# Patient Record
Sex: Female | Born: 1975 | Race: White | Hispanic: No | Marital: Married | Smoking: Former smoker
Health system: Southern US, Community
[De-identification: ages and names within clinical notes are randomized; demographics above are authoritative.]

## PROBLEM LIST (undated history)

## (undated) DIAGNOSIS — K589 Irritable bowel syndrome without diarrhea: Secondary | ICD-10-CM

## (undated) DIAGNOSIS — E119 Type 2 diabetes mellitus without complications: Secondary | ICD-10-CM

## (undated) DIAGNOSIS — H52209 Unspecified astigmatism, unspecified eye: Secondary | ICD-10-CM

## (undated) DIAGNOSIS — F419 Anxiety disorder, unspecified: Secondary | ICD-10-CM

## (undated) DIAGNOSIS — E282 Polycystic ovarian syndrome: Secondary | ICD-10-CM

## (undated) DIAGNOSIS — Z9889 Other specified postprocedural states: Secondary | ICD-10-CM

## (undated) DIAGNOSIS — K219 Gastro-esophageal reflux disease without esophagitis: Secondary | ICD-10-CM

## (undated) DIAGNOSIS — F32A Depression, unspecified: Secondary | ICD-10-CM

## (undated) DIAGNOSIS — Z973 Presence of spectacles and contact lenses: Secondary | ICD-10-CM

## (undated) DIAGNOSIS — M797 Fibromyalgia: Secondary | ICD-10-CM

## (undated) DIAGNOSIS — Z87898 Personal history of other specified conditions: Secondary | ICD-10-CM

## (undated) HISTORY — DX: Fibromyalgia: M79.7

## (undated) HISTORY — DX: Type 2 diabetes mellitus without complications: E11.9

## (undated) HISTORY — DX: Polycystic ovarian syndrome: E28.2

## (undated) HISTORY — PX: KNEE CARTILAGE SURGERY: SHX688

## (undated) HISTORY — PX: HX HYSTERECTOMY: SHX81

## (undated) HISTORY — PX: HX ACL RECONSTRUCTION: SHX115

## (undated) HISTORY — PX: HX TUBAL LIGATION: SHX77

## (undated) HISTORY — DX: Irritable bowel syndrome, unspecified: K58.9

## (undated) HISTORY — PX: HX MENISCECTOMY: SHX123

## (undated) HISTORY — PX: KNEE SURGERY: SHX244

---

## 2011-01-14 ENCOUNTER — Emergency Department (HOSPITAL_COMMUNITY): Payer: Worker's Compensation

## 2011-01-14 ENCOUNTER — Emergency Department (HOSPITAL_COMMUNITY)
Admission: EM | Admit: 2011-01-14 | Discharge: 2011-01-14 | Disposition: A | Discharge status: Home / Self Care | Payer: Worker's Compensation | Attending: Emergency Medicine | Admitting: Emergency Medicine

## 2011-01-14 ENCOUNTER — Encounter (HOSPITAL_COMMUNITY): Payer: Self-pay | Admitting: Emergency Medicine

## 2011-01-14 DIAGNOSIS — M25569 Pain in unspecified knee: Secondary | ICD-10-CM | POA: Insufficient documentation

## 2011-01-14 DIAGNOSIS — S8990XA Unspecified injury of unspecified lower leg, initial encounter: Secondary | ICD-10-CM | POA: Insufficient documentation

## 2011-01-14 DIAGNOSIS — S99929A Unspecified injury of unspecified foot, initial encounter: Secondary | ICD-10-CM | POA: Insufficient documentation

## 2011-01-14 DIAGNOSIS — X500XXA Overexertion from strenuous movement or load, initial encounter: Secondary | ICD-10-CM | POA: Insufficient documentation

## 2011-01-14 MED ORDER — OXYCODONE-ACETAMINOPHEN 5-325 MG PO TABS: 1.0000 | ORAL_TABLET | Freq: Four times a day (QID) | ORAL | Status: AC | PRN

## 2011-01-14 NOTE — ED Notes (Signed)
ZOX:[id number]<VW> Expected date:01/14/11<BR> Expected time:12:48 PM<BR> Means of arrival:Ambulance<BR> Comments:<BR> EMS 40 GC, 36 y/o female Right knee pain. Twisted knee on step.

## 2011-01-14 NOTE — ED Notes (Signed)
Stepped wrong while at work and has rt knee pain, has had a previous injury to this knee, no swelling or defomity noted,

## 2011-01-14 NOTE — ED Provider Notes (Signed)
History     CSN: [id number]  Arrival date & time 01/14/11  1305   First MD Initiated Contact with Patient 01/14/11 1342      Chief Complaint  Patient presents with  . Knee Pain    (Consider location/radiation/quality/duration/timing/severity/associated sxs/prior treatment) HPI  Pt stepped the wrong way while at work and felt a pop on the lateral side of  her right knee. Since then she has been unable to straighten her leg completely. She is also experiencing significant pain. She says the sensation is such that she feels like something is "balled up in their and keeping her knee from straightening". She denies falling, head injury, LOC. Pt had ACL repair surgery on this knee "many many years ago when she was in he [employer]".  She has no other significant PMH.   History reviewed. No pertinent past medical history.  History reviewed. No pertinent past surgical history.  No family history on file.  History  Substance Use Topics  . Smoking status: Not on file  . Smokeless tobacco: Not on file  . Alcohol Use: Not on file    OB History    Grav Para Term Preterm Abortions TAB SAB Ect Mult Living                  Review of Systems  All other systems reviewed and are negative.    Allergies  Review of patient's allergies indicates no known allergies.  Home Medications   Current Outpatient Rx  Name Route Sig Dispense Refill  . METFORMIN HCL 500 MG PO TABS Oral Take 500 mg by mouth daily with breakfast.    . OXYCODONE-ACETAMINOPHEN 5-325 MG PO TABS Oral Take 1 tablet by mouth every 6 (six) hours as needed for pain. 15 tablet 0    BP 112/70  Pulse 66  Temp 97.6 F (36.4 C)  SpO2 100%  Physical Exam  Constitutional: She is oriented to person, place, and time. She appears well-developed and well-nourished.  HENT:  Head: Normocephalic and atraumatic.  Eyes: Pupils are equal, round, and reactive to light.  Neck: Trachea normal, normal range of motion and full passive  range of motion without pain. Neck supple.  Cardiovascular: Normal rate, regular rhythm and normal pulses.   Pulmonary/Chest: Effort normal. Chest wall is not dull to percussion. She exhibits no tenderness, no crepitus, no edema, no deformity and no retraction.  Abdominal: Normal appearance.  Musculoskeletal:       Right knee: She exhibits decreased range of motion, swelling and effusion. She exhibits no ecchymosis, no deformity, no laceration, no erythema, normal alignment, no LCL laxity, normal patellar mobility, no bony tenderness, normal meniscus and no MCL laxity. tenderness found. Lateral joint line tenderness noted. No medial joint line, no MCL, no LCL and no patellar tendon tenderness noted.       Legs: Lymphadenopathy:       Head (right side): No submental, no submandibular, no tonsillar, no preauricular, no posterior auricular and no occipital adenopathy present.       Head (left side): No submental, no submandibular, no tonsillar, no preauricular, no posterior auricular and no occipital adenopathy present.    She has no cervical adenopathy.    She has no axillary adenopathy.  Neurological: She is oriented to person, place, and time. She has normal strength.  Skin: Skin is warm, dry and intact.  Psychiatric: Her speech is normal. Cognition and memory are normal.    ED Course  Procedures (including critical care time)  Labs Reviewed - No data to display Dg Knee Complete 4 Views Right  01/14/2011  *RADIOLOGY REPORT*  Clinical Data: Right knee pain.  RIGHT KNEE - COMPLETE 4+ VIEW  Comparison: None  Findings: There are surgical changes from ACL reconstruction. There are moderate tricompartmental degenerative changes.  No acute fracture or osteochondral abnormality.  No definite joint effusion.  IMPRESSION: Degenerative changes and postoperative changes but no acute fracture.  Original Report Authenticated By: P. Loralie Champagne, M.D.     1. Knee injury       MDM  Patient given  crutches and a knee sleeve. Pt is to call Ortho today or tomorrow for a follow up apointment and further management. She has been made aware that while the xray was negative, it does not show ligaments and tendons. In the meantime the patient is to ice, rest and elevate her knee.        Dorthula Matas, PA 01/15/11 [phone]

## 2011-01-15 NOTE — ED Provider Notes (Signed)
Medical screening examination/treatment/procedure(s) were performed by non-physician practitioner and as supervising physician I was immediately available for consultation/collaboration.  Donnetta Hutching, MD 01/15/11 1254

## 2011-01-20 ENCOUNTER — Other Ambulatory Visit: Payer: Self-pay | Admitting: Orthopedic Surgery

## 2011-01-20 ENCOUNTER — Ambulatory Visit
Admission: RE | Admit: 2011-01-20 | Discharge: 2011-01-20 | Disposition: A | Discharge status: Home / Self Care | Payer: PRIVATE HEALTH INSURANCE | Source: Ambulatory Visit | Attending: Orthopedic Surgery | Admitting: Orthopedic Surgery

## 2011-01-20 DIAGNOSIS — M25561 Pain in right knee: Secondary | ICD-10-CM

## 2011-03-16 ENCOUNTER — Other Ambulatory Visit: Payer: Self-pay | Admitting: Hematology and Oncology

## 2013-09-18 DIAGNOSIS — N390 Urinary tract infection, site not specified: Secondary | ICD-10-CM | POA: Insufficient documentation

## 2013-09-18 DIAGNOSIS — M545 Low back pain, unspecified: Secondary | ICD-10-CM | POA: Insufficient documentation

## 2014-10-24 ENCOUNTER — Emergency Department (HOSPITAL_COMMUNITY)
Admission: EM | Admit: 2014-10-24 | Discharge: 2014-10-24 | Disposition: A | Discharge status: Home / Self Care | Payer: BLUE CROSS/BLUE SHIELD | Attending: Emergency Medicine | Admitting: Emergency Medicine

## 2014-10-24 ENCOUNTER — Encounter (HOSPITAL_COMMUNITY): Payer: Self-pay | Admitting: Emergency Medicine

## 2014-10-24 ENCOUNTER — Emergency Department (HOSPITAL_COMMUNITY): Payer: BLUE CROSS/BLUE SHIELD

## 2014-10-24 DIAGNOSIS — R079 Chest pain, unspecified: Secondary | ICD-10-CM | POA: Diagnosis present

## 2014-10-24 DIAGNOSIS — R0789 Other chest pain: Secondary | ICD-10-CM

## 2014-10-24 DIAGNOSIS — Z87891 Personal history of nicotine dependence: Secondary | ICD-10-CM | POA: Insufficient documentation

## 2014-10-24 DIAGNOSIS — R000 Tachycardia, unspecified: Secondary | ICD-10-CM | POA: Diagnosis not present

## 2014-10-24 DIAGNOSIS — E119 Type 2 diabetes mellitus without complications: Secondary | ICD-10-CM | POA: Diagnosis not present

## 2014-10-24 DIAGNOSIS — Z79899 Other long term (current) drug therapy: Secondary | ICD-10-CM | POA: Insufficient documentation

## 2014-10-24 DIAGNOSIS — K219 Gastro-esophageal reflux disease without esophagitis: Secondary | ICD-10-CM

## 2014-10-24 HISTORY — DX: Type 2 diabetes mellitus without complications: E11.9

## 2014-10-24 LAB — BASIC METABOLIC PANEL
ANION GAP: 11 (ref 5–15)
BUN: 20 mg/dL (ref 6–20)
CALCIUM: 9.3 mg/dL (ref 8.9–10.3)
CO2: 21 mmol/L — ABNORMAL LOW (ref 22–32)
CREATININE: 0.87 mg/dL (ref 0.44–1.00)
Chloride: 107 mmol/L (ref 101–111)
GLUCOSE: 139 mg/dL — AB (ref 65–99)
POTASSIUM: 4.1 mmol/L (ref 3.5–5.1)
SODIUM: 139 mmol/L (ref 135–145)

## 2014-10-24 LAB — HEPATIC FUNCTION PANEL
ALT: 23 U/L (ref 14–54)
AST: 20 U/L (ref 15–41)
Albumin: 3.6 g/dL (ref 3.5–5.0)
Alkaline Phosphatase: 67 U/L (ref 38–126)
Bilirubin, Direct: <0.1 mg/dL — ABNORMAL LOW (ref 0.1–0.5)
Total Bilirubin: 0.4 mg/dL (ref 0.3–1.2)
Total Protein: 7.2 g/dL (ref 6.5–8.1)

## 2014-10-24 LAB — I-STAT TROPONIN, ED
Troponin i, poc: 0 ng/mL (ref 0.00–0.08)
Troponin i, poc: 0 ng/mL (ref 0.00–0.08)

## 2014-10-24 LAB — D-DIMER, QUANTITATIVE: D-Dimer, Quant: 0.34 ug{FEU}/mL (ref 0.00–0.48)

## 2014-10-24 LAB — URINALYSIS, ROUTINE W REFLEX MICROSCOPIC
Bilirubin Urine: NEGATIVE
Glucose, UA: >1000 mg/dL — AB
Hgb urine dipstick: NEGATIVE
Ketones, ur: >80 mg/dL — AB
Leukocytes, UA: NEGATIVE
Nitrite: NEGATIVE
Protein, ur: NEGATIVE mg/dL
Specific Gravity, Urine: 1.042 — ABNORMAL HIGH (ref 1.005–1.030)
Urobilinogen, UA: 0.2 mg/dL (ref 0.0–1.0)
pH: 5 (ref 5.0–8.0)

## 2014-10-24 LAB — CBC
HEMATOCRIT: 42.1 % (ref 36.0–46.0)
Hemoglobin: 14.1 g/dL (ref 12.0–15.0)
MCH: 30.3 pg (ref 26.0–34.0)
MCHC: 33.5 g/dL (ref 30.0–36.0)
MCV: 90.3 fL (ref 78.0–100.0)
Platelets: 366 10*3/uL (ref 150–400)
RBC: 4.66 MIL/uL (ref 3.87–5.11)
RDW: 13.6 % (ref 11.5–15.5)
WBC: 10.5 10*3/uL (ref 4.0–10.5)

## 2014-10-24 LAB — URINE MICROSCOPIC-ADD ON

## 2014-10-24 LAB — LIPASE, BLOOD: Lipase: 30 U/L (ref 11–51)

## 2014-10-24 MED ORDER — LORAZEPAM 1 MG PO TABS
1.0000 mg | ORAL_TABLET | Freq: Once | ORAL | Status: AC
  Administered 2014-10-24: 1 mg via ORAL
  Filled 2014-10-24: qty 1

## 2014-10-24 MED ORDER — SODIUM CHLORIDE 0.9 % IV BOLUS (SEPSIS)
1000.0000 mL | Freq: Once | INTRAVENOUS | Status: AC
  Administered 2014-10-24: 1000 mL via INTRAVENOUS

## 2014-10-24 NOTE — ED Provider Notes (Signed)
CSN: [id number]     Arrival date & time 10/24/14  4098 History   First MD Initiated Contact with Patient 10/24/14 1004     Chief Complaint  Patient presents with  . Chest Pain     (Consider location/radiation/quality/duration/timing/severity/associated sxs/prior Treatment) HPI  Jasmine Logan is a 39 y.o F with a pmhx of DM who presents the emergency department today complaining of chest pain. States that she began to have substernal chest pain on Tuesday, intermittent in nature. Patient saw her PCP yesterday who told her that it was noncardiac related and sent home on antacid medication for GERD. Patient states that today she had an episode of dizziness and near syncope in her chest pain has worsened now radiating into her left shoulder. Patient came to the emergent department to rule out cardiac-related cause. Denies shortness of breath, nausea, current dizziness, syncope, lightheadedness, headache, numbness/tingling, weakness. Patient is on oral birth control. Denies tobacco use. Past Medical History  Diagnosis Date  . Diabetes mellitus without complication [facility])    Past Surgical History  Procedure Laterality Date  . Cesarean section    . Knee surgery     No family history on file. Social History  Substance Use Topics  . Smoking status: Former Games developer  . Smokeless tobacco: None  . Alcohol Use: No   OB History    No data available     Review of Systems  All other systems reviewed and are negative.     Allergies  Review of patient's allergies indicates no known allergies.  Home Medications   Prior to Admission medications   Medication Sig Start Date End Date Taking? Authorizing Provider  dapagliflozin propanediol (FARXIGA) 10 MG TABS tablet Take 10 mg by mouth daily.   Yes Historical Provider, MD  esomeprazole (NEXIUM) 40 MG capsule Take 40 mg by mouth daily at 12 noon.   Yes Historical Provider, MD  Liraglutide (VICTOZA) 18 MG/3ML SOPN Inject 1.8 mg into the skin  daily.   Yes Historical Provider, MD  NAPROXEN PO Take 400 mg by mouth as needed (used as needed for mild pain).   Yes Historical Provider, MD  norethindrone-ethinyl estradiol (MICROGESTIN,JUNEL,LOESTRIN) 1-20 MG-MCG tablet Take 1 tablet by mouth daily.   Yes Historical Provider, MD   BP 134/68 mmHg  Pulse 99  Temp(Src) 99.1 F (37.3 C) (Oral)  Resp 22  Ht  (1.626 m)  Wt 208 lb (94.348 kg)  BMI 35.69 kg/m2  SpO2 100%  LMP 10/24/2014 Physical Exam  Constitutional: She is oriented to person, place, and time. She appears well-developed and well-nourished. No distress.  HENT:  Head: Normocephalic and atraumatic.  Mouth/Throat: No oropharyngeal exudate.  Eyes: Conjunctivae and EOM are normal. Pupils are equal, round, and reactive to light. Right eye exhibits no discharge. Left eye exhibits no discharge. No scleral icterus.  Neck: Normal range of motion. Neck supple.  Cardiovascular: Regular rhythm, normal heart sounds and intact distal pulses.  Exam reveals no gallop and no friction rub.   No murmur heard. Tachycardic to 105.  Pulmonary/Chest: Effort normal and breath sounds normal. No respiratory distress. She has no wheezes. She has no rales. She exhibits no tenderness.  Abdominal: Soft. Bowel sounds are normal. She exhibits no distension and no mass. There is tenderness ( Mild tenderness to epigastrium). There is no rebound and no guarding.  Musculoskeletal: Normal range of motion. She exhibits no edema.  Neurological: She is alert and oriented to person, place, and time. No cranial nerve deficit.  Strength 5/5 throughout. No sensory deficits.  No gait abnormality.  Skin: Skin is warm and dry. No rash noted. She is not diaphoretic. No erythema. No pallor.  Psychiatric: She has a normal mood and affect. Her behavior is normal.  Patient very nervous and anxious  Nursing note and vitals reviewed.   ED Course  Procedures (including critical care time) Labs Review Labs Reviewed   BASIC METABOLIC PANEL - Abnormal; Notable for the following:    CO2 21 (*)    Glucose, Bld 139 (*)    All other components within normal limits  HEPATIC FUNCTION PANEL - Abnormal; Notable for the following:    Bilirubin, Direct <0.1 (*)    All other components within normal limits  URINALYSIS, ROUTINE W REFLEX MICROSCOPIC (NOT AT [facility]) - Abnormal; Notable for the following:    Specific Gravity, Urine 1.042 (*)    Glucose, UA >1000 (*)    Ketones, ur >80 (*)    All other components within normal limits  CBC  LIPASE, BLOOD  URINE MICROSCOPIC-ADD ON  D-DIMER, QUANTITATIVE (NOT AT [facility])  Rosezena SensorI-STAT TROPOININ, ED  Rosezena SensorI-STAT TROPOININ, ED    Imaging Review Dg Chest 2 View  10/24/2014  CLINICAL DATA:  Chest pain for 3 days. EXAM: CHEST  2 VIEW COMPARISON:  12/29/2013 FINDINGS: The heart size and mediastinal contours are within normal limits. Both lungs are clear. No evidence of pneumothorax or pleural effusion. The visualized skeletal structures are unremarkable. IMPRESSION: No active cardiopulmonary disease. Electronically Signed   By: Myles RosenthalJohn  Stahl M.D.   On: 10/24/2014 11:41   I have personally reviewed and evaluated these images and lab results as part of my medical decision-making.   EKG Interpretation   Date/Time:  Thursday October 24 2014 10:11:10 EDT Ventricular Rate:  103 PR Interval:  128 QRS Duration: 80 QT Interval:  362 QTC Calculation: 474 R Axis:   -4 Text Interpretation:  Sinus tachycardia Otherwise normal ECG Confirmed by  ALLEN  MD, ANTHONY ([id number]) on 10/24/2014 10:20:08 AM      MDM   Final diagnoses:  Atypical chest pain  Gastroesophageal reflux disease, esophagitis presence not specified    Pt with low HEART score presents with atypical chest pain onset 2 days ago. Pt is tachycardic to 103. However, pt states that this is typical for her. Patient also with high specific gravity of urine. Dehydration may be causing patient to be tachycardic as well. We'll give 1  L fluids and 1 of Ativan for anxiety.  EKG unremarkable. Troponin negative. Patient is on oral birth control. Cannot PERC out. D-dimer ordered and is negative. Rule out PE. Low suspicion ACS. Patient now pain free.  Patient also with epigastrium discomfort. Suspect this is more related to GERD and anxiety. Patient placed on Nexium.  Return precautions outlined in patient discharge instructions. Patient was discussed with and seen by Dr. Freida BusmanAllen who agrees with the treatment plan.     Lester KinsmanSamantha Tripp [place], PA-C 10/24/14 1949

## 2014-10-24 NOTE — ED Provider Notes (Signed)
Medical screening examination/treatment/procedure(s) were conducted as a shared visit with non-physician practitioner(s) and myself.  I personally evaluated the patient during the encounter.   EKG Interpretation   Date/Time:  Thursday October 24 2014 10:11:10 EDT Ventricular Rate:  103 PR Interval:  128 QRS Duration: 80 QT Interval:  362 QTC Calculation: 474 R Axis:   -4 Text Interpretation:  Sinus tachycardia Otherwise normal ECG Confirmed by  Naftali Carchi  MD, Chanise Habeck ([id number]) on 10/24/2014 10:20:08 AM      Patient here complaining of abdominal discomfort and bloating. Has had increased anxiety. She is dehydrated here and no evidence of ACS or PE. Will treat symptomatically and discharge  Lorre NickAnthony Nasiir Monts, MD 10/24/14 1556

## 2014-10-24 NOTE — ED Notes (Signed)
CP onset tues, seen at PCP, still reports left sided CP, shoulder pain, c/o dizziness, SOB, nausea with no vomiting, A/O X4, NAD

## 2014-10-24 NOTE — Discharge Instructions (Signed)
Chest Pain Observation °It is often hard to give a specific diagnosis for the cause of chest pain. Among other possibilities your symptoms might be caused by inadequate oxygen delivery to your heart (angina). Angina that is not treated or evaluated can lead to a heart attack (myocardial infarction) or death. °Blood tests, electrocardiograms, and X-rays may have been done to help determine a possible cause of your chest pain. After evaluation and observation, your health care provider has determined that it is unlikely your pain was caused by an unstable condition that requires hospitalization. However, a full evaluation of your pain may need to be completed, with additional diagnostic testing as directed. It is very important to keep your follow-up appointments. Not keeping your follow-up appointments could result in permanent heart damage, disability, or death. If there is any problem keeping your follow-up appointments, you must call your health care provider. °HOME CARE INSTRUCTIONS  °Due to the slight chance that your pain could be angina, it is important to follow your health care provider's treatment plan and also maintain a healthy lifestyle: °· Maintain or work toward achieving a healthy weight. °· Stay physically active and exercise regularly. °· Decrease your salt intake. °· Eat a balanced, healthy diet. Talk to a dietitian to learn about heart-healthy foods. °· Increase your fiber intake by including whole grains, vegetables, fruits, and nuts in your diet. °· Avoid situations that cause stress, anger, or depression. °· Take medicines as advised by your health care provider. Report any side effects to your health care provider. Do not stop medicines or adjust the dosages on your own. °· Quit smoking. Do not use nicotine patches or gum until you check with your health care provider. °· Keep your blood pressure, blood sugar, and cholesterol levels within normal limits. °· Limit alcohol intake to no more than  1 drink per day for women who are not pregnant and 2 drinks per day for men. °· Do not abuse drugs. °SEEK IMMEDIATE MEDICAL CARE IF: °You have severe chest pain or pressure which may include symptoms such as: °· You feel pain or pressure in your arms, neck, jaw, or back. °· You have severe back or abdominal pain, feel sick to your stomach (nauseous), or throw up (vomit). °· You are sweating profusely. °· You are having a fast or irregular heartbeat. °· You feel short of breath while at rest. °· You notice increasing shortness of breath during rest, sleep, or with activity. °· You have chest pain that does not get better after rest or after taking your usual medicine. °· You wake from sleep with chest pain. °· You are unable to sleep because you cannot breathe. °· You develop a frequent cough or you are coughing up blood. °· You feel dizzy, faint, or experience extreme fatigue. °· You develop severe weakness, dizziness, fainting, or chills. °Any of these symptoms may represent a serious problem that is an emergency. Do not wait to see if the symptoms will go away. Call your local emergency services (911 in the [place] Do not drive yourself to the hospital. °MAKE SURE YOU: °· Understand these instructions. °· Will watch your condition. °· Will get help right away if you are not doing well or get worse. °  °This information is not intended to replace advice given to you by your health care provider. Make sure you discuss any questions you have with your health care provider. °  °Document Released: 01/23/2010 Document Revised: 12/26/2012 Document Reviewed: 06/22/2012 °Elsevier Interactive Patient   Education 2016 [place].  Gastroesophageal Reflux Disease, Adult Normally, food travels down the esophagus and stays in the stomach to be digested. However, when a person has gastroesophageal reflux disease (GERD), food and stomach acid move back up into the esophagus. When this happens, the esophagus becomes sore and  inflamed. Over time, GERD can create small holes (ulcers) in the lining of the esophagus.  CAUSES This condition is caused by a problem with the muscle between the esophagus and the stomach (lower esophageal sphincter, or LES). Normally, the LES muscle closes after food passes through the esophagus to the stomach. When the LES is weakened or abnormal, it does not close properly, and that allows food and stomach acid to go back up into the esophagus. The LES can be weakened by certain dietary substances, medicines, and medical conditions, including:  Tobacco use.  Pregnancy.  Having a hiatal hernia.  Heavy alcohol use.  Certain foods and beverages, such as coffee, chocolate, onions, and peppermint. RISK FACTORS This condition is more likely to develop in:  People who have an increased body weight.  People who have connective tissue disorders.  People who use NSAID medicines. SYMPTOMS Symptoms of this condition include:  Heartburn.  Difficult or painful swallowing.  The feeling of having a lump in the throat.  Abitter taste in the mouth.  Bad breath.  Having a large amount of saliva.  Having an upset or bloated stomach.  Belching.  Chest pain.  Shortness of breath or wheezing.  Ongoing (chronic) cough or a night-time cough.  Wearing away of tooth enamel.  Weight loss. Different conditions can cause chest pain. Make sure to see your health care provider if you experience chest pain. DIAGNOSIS Your health care provider will take a medical history and perform a physical exam. To determine if you have mild or severe GERD, your health care provider may also monitor how you respond to treatment. You may also have other tests, including:  An endoscopy toexamine your stomach and esophagus with a small camera.  A test thatmeasures the acidity level in your esophagus.  A test thatmeasures how much pressure is on your esophagus.  A barium swallow or modified barium  swallow to show the shape, size, and functioning of your esophagus. TREATMENT The goal of treatment is to help relieve your symptoms and to prevent complications. Treatment for this condition may vary depending on how severe your symptoms are. Your health care provider may recommend:  Changes to your diet.  Medicine.  Surgery. HOME CARE INSTRUCTIONS Diet  Follow a diet as recommended by your health care provider. This may involve avoiding foods and drinks such as:  Coffee and tea (with or without caffeine).  Drinks that containalcohol.  Energy drinks and sports drinks.  Carbonated drinks or sodas.  Chocolate and cocoa.  Peppermint and mint flavorings.  Garlic and onions.  Horseradish.  Spicy and acidic foods, including peppers, chili powder, curry powder, vinegar, hot sauces, and barbecue sauce.  Citrus fruit juices and citrus fruits, such as oranges, lemons, and limes.  Tomato-based foods, such as red sauce, chili, salsa, and pizza with red sauce.  Fried and fatty foods, such as donuts, french fries, potato chips, and high-fat dressings.  High-fat meats, such as hot dogs and fatty cuts of red and white meats, such as rib eye steak, sausage, ham, and bacon.  High-fat dairy items, such as whole milk, butter, and cream cheese.  Eat small, frequent meals instead of large meals.  Avoid drinking  large amounts of liquid with your meals.  Avoid eating meals during the 2-3 hours before bedtime.  Avoid lying down right after you eat.  Do not exercise right after you eat. General Instructions  Pay attention to any changes in your symptoms.  Take over-the-counter and prescription medicines only as told by your health care provider. Do not take aspirin, ibuprofen, or other NSAIDs unless your health care provider told you to do so.  Do not use any tobacco products, including cigarettes, chewing tobacco, and e-cigarettes. If you need help quitting, ask your health care  provider.  Wear loose-fitting clothing. Do not wear anything tight around your waist that causes pressure on your abdomen.  Raise (elevate) the head of your bed 6 inches (15cm).  Try to reduce your stress, such as with yoga or meditation. If you need help reducing stress, ask your health care provider.  If you are overweight, reduce your weight to an amount that is healthy for you. Ask your health care provider for guidance about a safe weight loss goal.  Keep all follow-up visits as told by your health care provider. This is important. SEEK MEDICAL CARE IF:  You have new symptoms.  You have unexplained weight loss.  You have difficulty swallowing, or it hurts to swallow.  You have wheezing or a persistent cough.  Your symptoms do not improve with treatment.  You have a hoarse voice. SEEK IMMEDIATE MEDICAL CARE IF:  You have pain in your arms, neck, jaw, teeth, or back.  You feel sweaty, dizzy, or light-headed.  You have chest pain or shortness of breath.  You vomit and your vomit looks like blood or coffee grounds.  You faint.  Your stool is bloody or black.  You cannot swallow, drink, or eat.   This information is not intended to replace advice given to you by your health care provider. Make sure you discuss any questions you have with your health care provider.  Follow up with cardiology for further evaluation of atypical chest pain. Possible need for stress test. Take prescribed antacids for GERD. Avoid spicy and carbonated food and beverages. Return to the emergency department if you experience worsening of your symptoms, loss of consciousness, shortness of breath.

## 2014-10-24 NOTE — ED Notes (Signed)
Phlebotomy at bedside.

## 2015-05-12 ENCOUNTER — Ambulatory Visit: Admit: 2015-05-12 | Discharge: 2015-05-12 | Payer: MEDICAID | Attending: Surgery | Primary: Family Medicine

## 2015-05-12 DIAGNOSIS — R928 Other abnormal and inconclusive findings on diagnostic imaging of breast: Secondary | ICD-10-CM

## 2015-05-12 NOTE — Progress Notes (Signed)
CHIEF COMPLAINT: Right breast mass    HISTORY OF PRESENT ILLNESS:    Tammy Nguyen is a 40 y.o. white female who presents with an with an abnormal mammogram.  She had some questionable palpable abnormalities and had a mammogram that she is concerned about.    Mammogram done Myrtice 11, 2017 shows extremely dense breast tissue. There are no dominant mass lesions demonstrated. There are no suspicious clustered calcifications or areas of skin thickening.    Right breast ultrasound done same day directed to the region of the questionable palpable abnormality upper outer right breast.  There are multiple small anechoic, cystic lesion suggested. The largest measures 7 mm in size. No solid lesion is identified.    IMPRESSION:  #1 negative mammogram with no evidence of malignancy, although, the dense breast parenchyma does limit evaluation. Routine follow-up mammography in 1 year is recommended  #2 multiple small cysts noted in the lateral right breast.    BI-RADS Category 2-benign findings        She has a negative family history of breast cancer.        BREAST EXAM:    Tammy Nguyen  has unremarkable fibrocystic changes throughout both breasts. There are no dominant masses, no skin or nipple changes and no axillary adenopathy.  I see nothing suspicious on physical examination.      The area of interest  feels like normal nodular breast tissue.    IMPRESSION:  Benign fibrocystic changes                           Low probability of malignancy    PLAN:    I plan to have patient to [facility] in 6 months for physical exam only. She will call if there are any changes.     DISCUSSION:  We discussed  the fibrocystic disease and its relationship to breast cancer  , the pathophysiology of breast cancer, the pathophysiology of fibrocystic disease, the importance of routine mammograms, the technique of good breast self-examination and encouraged her and the effect of caffeine on her breasts    Over 50% of visit time was spent counseling patient.  30  minutes of face to face time spent with patient.

## 2015-05-13 DIAGNOSIS — R928 Other abnormal and inconclusive findings on diagnostic imaging of breast: Secondary | ICD-10-CM

## 2015-11-12 ENCOUNTER — Encounter: Attending: Surgery | Primary: Family Medicine

## 2015-11-12 NOTE — Progress Notes (Signed)
CHIEF COMPLAINT: Right breast mass    HISTORY OF PRESENT ILLNESS:    Tammy Nguyen is a 40 y.o. white female who presents with an with an abnormal mammogram.  She had some questionable palpable abnormalities and had a mammogram that she is concerned about.    Mammogram done Myrtice 11, 2017 shows extremely dense breast tissue. There are no dominant mass lesions demonstrated. There are no suspicious clustered calcifications or areas of skin thickening.    Right breast ultrasound done same day directed to the region of the questionable palpable abnormality upper outer right breast.  There are multiple small anechoic, cystic lesion suggested. The largest measures 7 mm in size. No solid lesion is identified.    IMPRESSION:  #1 negative mammogram with no evidence of malignancy, although, the dense breast parenchyma does limit evaluation. Routine follow-up mammography in 1 year is recommended  #2 multiple small cysts noted in the lateral right breast.    BI-RADS Category 2-benign findings        She has a negative family history of breast cancer.        BREAST EXAM:    Tammy Nguyen  has unremarkable fibrocystic changes throughout both breasts. There are no dominant masses, no skin or nipple changes and no axillary adenopathy.  I see nothing suspicious on physical examination.      The area of interest  feels like normal nodular breast tissue.    IMPRESSION:  Benign fibrocystic changes                           Low probability of malignancy    PLAN:    I plan to have patient to [facility] in 6 months for physical exam only. She will call if there are any changes.     DISCUSSION:  We discussed  the fibrocystic disease and its relationship to breast cancer  , the pathophysiology of breast cancer, the pathophysiology of fibrocystic disease, the importance of routine mammograms, the technique of good breast self-examination and encouraged her and the effect of caffeine on her breasts    Over 50% of visit time was spent counseling patient.  30  minutes of face to face time spent with patient.

## 2016-07-28 NOTE — Progress Notes (Signed)
This encounter was created in error - please disregard.

## 2017-10-06 DIAGNOSIS — R002 Palpitations: Secondary | ICD-10-CM | POA: Insufficient documentation

## 2017-10-06 DIAGNOSIS — E119 Type 2 diabetes mellitus without complications: Secondary | ICD-10-CM | POA: Insufficient documentation

## 2017-10-12 ENCOUNTER — Encounter (INDEPENDENT_AMBULATORY_CARE_PROVIDER_SITE_OTHER): Payer: Self-pay

## 2017-10-12 ENCOUNTER — Ambulatory Visit: Payer: BC Managed Care – PPO | Admitting: Cardiology

## 2017-10-12 ENCOUNTER — Encounter: Payer: Self-pay | Admitting: Cardiology

## 2017-10-12 DIAGNOSIS — R002 Palpitations: Secondary | ICD-10-CM

## 2017-10-12 DIAGNOSIS — E1369 Other specified diabetes mellitus with other specified complication: Secondary | ICD-10-CM | POA: Diagnosis not present

## 2017-10-12 DIAGNOSIS — R0789 Other chest pain: Secondary | ICD-10-CM

## 2017-10-12 NOTE — Patient Instructions (Signed)
Medication Instructions:  Your physician recommends that you continue on your current medications as directed. Please refer to the Current Medication list given to you today.  If you need a refill on your cardiac medications before your next appointment, please call your pharmacy.   Lab work: None  If you have labs (blood work) drawn today and your tests are completely normal, you will receive your results only by: Marland Kitchen MyChart Message (if you have MyChart) OR . A paper copy in the mail If you have any lab test that is abnormal or we need to change your treatment, we will call you to review the results.  Testing/Procedures: Your physician has requested that you have an exercise tolerance test. For further information please visit [email or web address] Please also follow instruction sheet, as given.  Your physician has recommended that you wear an event monitor. Event monitors are medical devices that record the heart's electrical activity. Doctors most often [place] these monitors to diagnose arrhythmias. Arrhythmias are problems with the speed or rhythm of the heartbeat. The monitor is a small, portable device. You can wear one while you do your normal daily activities. This is usually used to diagnose what is causing palpitations/syncope (passing out).  Follow-Up: At [facility], you and your health needs are our priority.  As part of our continuing mission to provide you with exceptional heart care, we have created designated Provider Care Teams.  These Care Teams include your primary Cardiologist (physician) and Advanced Practice Providers (APPs -  Physician Assistants and Nurse Practitioners) who all work together to provide you with the care you need, when you need it.  You will need a follow up appointment in 6 months.  Please call our office 2 months in advance to schedule this appointment.  You may see another member of our [employer] Provider Team in [place]: Gypsy Balsam,  MD . Norman Herrlich, MD  Any Other Special Instructions Will Be Listed Below (If Applicable).

## 2017-10-12 NOTE — Progress Notes (Signed)
Cardiology Office Note:    Date:  10/12/2017   ID:  [name], [name] [birth year], MRN [id number]  PCP:  Paulina Fusi, MD  Cardiologist:  Garwin Brothers, MD   Referring MD: Paulina Fusi, MD    ASSESSMENT:    1. Palpitation   2. Other specified diabetes mellitus with other specified complication, unspecified whether long term insulin use (HCC)   3. Chest discomfort    PLAN:    In order of problems listed above:  1. Primary prevention stressed with the patient.  Importance of compliance with diet and medication stressed and she vocalized understanding.  Her blood pressure is stable.  Diet was discussed for diabetes mellitus and weight reduction was stressed and she vocalized understanding.  She will need to talk to her primary care physician about issues such as statin therapy and aspirin for diabetes.  She also could be considered for ACE inhibitor therapy for diabetes but her blood pressure is borderline.  All these issues I would leave up to her primary care provider to decide. 2. In view of her significant symptoms she will have a 1 month event monitor.  This will help me assess her palpitations.  Her TSH is unremarkable.  We will also do exercise treadmill stress test to reassure her.  She has had coronary angiography about 2 years ago which was unremarkable and I discussed this with her at length. 3. Patient will be seen in follow-up appointment in 6 months or earlier if the patient has any concerns    Medication Adjustments/Labs and Tests Ordered: Current medicines are reviewed at length with the patient today.  Concerns regarding medicines are outlined above.  No orders of the defined types were placed in this encounter.  No orders of the defined types were placed in this encounter.    History of Present Illness:    Jasmine Logan is a 42 y.o. female who is being seen today for the evaluation of palpitations and chest discomfort at the request of Paulina Fusi, MD.  Patient is a pleasant 42 year old female.  She has past medical history of diabetes mellitus.  She is overweight.  She mentions to me that she had an episode of a strange heart feeling.  She says this was something like palpitations lasted about 15 minutes.  She went to the urgent care and there was she was sent to the emergency room.  In the emergency room she was treated or other evaluated and discharged without any significant findings and she is here for follow-up.  No chest pain orthopnea or PND.  She is an active lady.  No orthopnea or PND she does not exercise on a regular basis.  At the time of my evaluation, the patient is alert awake oriented and in no distress.  Past Medical History:  Diagnosis Date  . Diabetes mellitus without complication [facility])     Past Surgical History:  Procedure Laterality Date  . CESAREAN SECTION    . KNEE SURGERY      Current Medications: Current Meds  Medication Sig  . busPIRone (BUSPAR) 5 MG tablet Take 5 mg by mouth as needed.  . Liraglutide (VICTOZA) 18 MG/3ML SOPN Inject 1.8 mg into the skin daily.  . ondansetron (ZOFRAN) 4 MG tablet Take 4 mg by mouth every 8 (eight) hours as needed for nausea or vomiting.     Allergies:   Patient has no known allergies.   Social History   Socioeconomic History  .  Marital status: Married    Spouse name: Not on file  . Number of children: Not on file  . Years of education: Not on file  . Highest education level: Not on file  Occupational History  . Not on file  Social Needs  . Financial resource strain: Not on file  . Food insecurity:    Worry: Not on file    Inability: Not on file  . Transportation needs:    Medical: Not on file    Non-medical: Not on file  Tobacco Use  . Smoking status: Former Games developer  . Smokeless tobacco: Never Used  Substance and Sexual Activity  . Alcohol use: No  . Drug use: Not on file  . Sexual activity: Yes    Birth control/protection: Pill  Lifestyle  .  Physical activity:    Days per week: Not on file    Minutes per session: Not on file  . Stress: Not on file  Relationships  . Social connections:    Talks on phone: Not on file    Gets together: Not on file    Attends religious service: Not on file    Active member of club or organization: Not on file    Attends meetings of clubs or organizations: Not on file    Relationship status: Not on file  Other Topics Concern  . Not on file  Social History Narrative  . Not on file     Family History: The patient's family history includes Cancer in her maternal grandfather and maternal grandmother.  ROS:   Please see the history of present illness.    All other systems reviewed and are negative.  EKGs/Labs/Other Studies Reviewed:    The following studies were reviewed today: EKG reveals sinus rhythm and nonspecific ST-T changes.   Recent Labs: No results found for requested labs within last 8760 hours.  Recent Lipid Panel No results found for: CHOL, TRIG, HDL, CHOLHDL, VLDL, LDLCALC, LDLDIRECT  Physical Exam:    VS:  BP 122/70 (BP Location: Right Arm, Patient Position: Sitting, Cuff Size: Normal)   Pulse 76   Ht 5\' 4"  (1.626 m)   Wt 212 lb (96.2 kg)   SpO2 98%   BMI 36.39 kg/m     Wt Readings from Last 3 Encounters:  10/12/17 212 lb (96.2 kg)  10/24/14 208 lb (94.3 kg)     GEN: Patient is in no acute distress HEENT: Normal NECK: No JVD; No carotid bruits LYMPHATICS: No lymphadenopathy CARDIAC: S1 S2 regular, 2/6 systolic murmur at the apex. RESPIRATORY:  Clear to auscultation without rales, wheezing or rhonchi  ABDOMEN: Soft, non-tender, non-distended MUSCULOSKELETAL:  No edema; No deformity  SKIN: Warm and dry NEUROLOGIC:  Alert and oriented x 3 PSYCHIATRIC:  Normal affect    Signed, Garwin Brothers, MD  10/12/2017 9:31 AM    [facility]

## 2017-11-04 ENCOUNTER — Ambulatory Visit (INDEPENDENT_AMBULATORY_CARE_PROVIDER_SITE_OTHER): Payer: BC Managed Care – PPO

## 2017-11-04 DIAGNOSIS — R002 Palpitations: Secondary | ICD-10-CM | POA: Diagnosis not present

## 2017-12-16 ENCOUNTER — Ambulatory Visit (INDEPENDENT_AMBULATORY_CARE_PROVIDER_SITE_OTHER): Payer: BC Managed Care – PPO

## 2017-12-16 DIAGNOSIS — R002 Palpitations: Secondary | ICD-10-CM | POA: Diagnosis not present

## 2017-12-16 LAB — EXERCISE TOLERANCE TEST
CSEPEDS: 0 s
CSEPEW: 7 METS
CSEPHR: 97 %
CSEPPHR: 173 {beats}/min
Exercise duration (min): 6 min
MPHR: 178 {beats}/min
RPE: 16
Rest HR: 94 {beats}/min

## 2018-04-24 ENCOUNTER — Telehealth: Payer: Self-pay | Admitting: Cardiology

## 2018-04-24 NOTE — Telephone Encounter (Signed)
Called patient to schedule recall and patient declined due to her dissatisfaction with her care from our office. She was frustrated that she did not get the care that she was hoping for and was just left with lots of unanswered questions.

## 2018-11-24 NOTE — Progress Notes (Signed)
 CHIEF COMPLAINT:  Chief Complaint   Patient presents with   . Gynecologic Exam     Annual exam       HPI:   [name] is 44 y.o. who has presented for a wellness visit without concerns.  She did not continue the OCPs given last year as they did not make a difference. Started on Metformin and notes improvement in her mood lability.       ALLERGIES:  Allergies   Allergen Reactions   . Gluten Other (See Comments)     Intolerance       MEDICATIONS:  Current Outpatient Medications on File Prior to Visit   Medication Sig Dispense Refill   . busPIRone (BUSPAR) 5 MG tablet Take 5 mg by mouth as needed.        . hyoscyamine (LEVSIN) 0.125 mg rapid dissolving tablet Take 125 mcg by mouth as needed.     . metFORMIN ER (GLUCOPHAGE-XR) 500 MG extended release tablet Take 1 tablet (500 mg total) by mouth nightly. 90 tablet 3   . ondansetron  (ZOFRAN ) 4 MG tablet Take 4 mg by mouth as needed.        SABRA JAN ULTRA BLUE TEST STRIP Strp test strips       . semaglutide (OZEMPIC) 0.25 mg or 0.5 mg(2 mg/1.5 mL) injection Inject 0.4 mLs (0.5 mg total) into the skin once a week. 4.5 mL 2   . [DISCONTINUED] norethindrone-e.estradiol-iron (LO LOESTRIN FE) 1 mg-10 mcg (24)/10 mcg (2) tablet Take 1 tablet by mouth daily. 84 tablet 3     No current facility-administered medications on file prior to visit.        PAST MEDICAL HISTORY:   has a past medical history of Anxiety, Depression, Diabetes mellitus (HCC), PCOS (polycystic ovarian syndrome), and PCOS (polycystic ovarian syndrome).    PAST SURGICAL HISTORY:  Past Surgical History:   Procedure Laterality Date   . ARTHROSCOPIC REPAIR ACL Right    . CARDIAC CATHETERIZATION     . CESAREAN SECTION     . KNEE ARTHROSCOPY     . TUBAL LIGATION         OBSTETRIC HISTORY:   [id number]    GYNECOLOGIC HISTORY:       FAMILY HISTORY:   family history includes Breast cancer in her cousin; Ovarian cancer in her maternal aunt.me    SOCIAL HISTORY  Social History     Social History Narrative   .  Not on file         REVIEW OF SYSTEMS:   GENERAL: no fevers, chills, significant changes in weight   HEENT: no changes in vision or hearing  BREAST: no pain, masses or nipple discharge  RESPIRATORY: no cough,labored breathing or shortness of breath  CARDIOVASCULAR: no chest pain or palpitations  GASTROINTESTINAL: no nausea, vomiting or diarrhea, no abdominal pain, regular bowel movements  GENITOURINARY: no dysuria, no vaginal discharge or irritation, denies urinary incontinence, urgency or incomplete emptying.  NEUROLOGICAL: no headaches, memory loss or weakness  MUSCULOSKELETAL: no bone or joint pain  SKIN: no rashes or lesions  PSYCHIATRY: no changes in mood, no anxiety or depression.       PHYSICAL EXAM:  BP 118/70   Pulse 69   Ht 5' 5 (1.651 m)   Wt 219 lb (99.3 kg)   LMP 11/11/2018   BMI 36.44 kg/m  Body mass index is 36.44 kg/m.      GENERAL APPEARANCE:  Well developed, well groomed.  No  acute distress. Color good.  MENTAL STATUS: Appears alert and oriented. Affect appropriate.  SKIN: Skin color and turgor normal. No suspicious lesions, masses, rashes, or ulcerations. Nails and hair appear normal.  HEAD: Normocephalic.  EARS: External ear w/o  masses or lesions.  Acuity to conversational tones good.  EYES:  Lids w/o defect, conjunctiva and sclera appear normal.   NECK: Symmetric, trachea midline. Thyroid nontender  w/o enlargement or masses.    BREASTS: Breasts appear normal and symmetric w/o palpable masses, skin changes, or nipple inversion. No discharge, rash, or skin retraction by inspection..  CHEST: Respirations unlabored.   Chest wall symmetric with no masses. Breath sounds clear bilaterally w/o wheezes,  rales, or rhonchi.  CV: Normal rate and rhythm, no murmurs, gallops or rubs.   GI/ABDOMEN: Abdomen soft and non tender. No guarding or rebound. No palpable masses or tenderness. Liver and spleen are w/o tenderness or enlargement.   GU: Normal vulva and introitus w/o masses, lesions, rashes, or  swelling. Vaginal mucosa  pink and moist without exudates, lesions, or ulcerations. Uterus is non tender, not enlarged and has smooth contour.  Adnexa not enlarged, non tender, and w/o masses. Cervix appears normal.    Lymphatic: no cervical, axillary, supraclavicular or inguinal adenopathy.   MS: Muscle tone and strength normal for age, w/o atrophy or abnormal movement.  EXTREMITIES: Joints w/full ROM, no clubbing, cyanosis or edema    A chaperone was present for the exam portion of the visit    COUNSELING AND EDUCATION    1. Routine gynecological examination         The ASCCP Pap Smear Guidelines delineating the frequency of Pap smear testing were reviewed with the patient today.  She has been instructed on monthly self-breast exam and we have discussed the importance of annual wellness exams and yearly mammograms starting at age 51.             Electronically signed by: Lorrene Garnette Sines, MD 11/24/2018 8:29 AM               Electronically signed by: Lorrene Garnette Sines, MD  11/24/18 [phone]

## 2019-05-10 ENCOUNTER — Encounter: Attending: Surgery | Primary: Family Medicine

## 2019-05-15 ENCOUNTER — Encounter: Attending: Surgery | Primary: Family Medicine

## 2021-01-21 ENCOUNTER — Emergency Department (HOSPITAL_COMMUNITY): Payer: No Typology Code available for payment source

## 2021-01-21 ENCOUNTER — Encounter (HOSPITAL_COMMUNITY): Payer: Self-pay | Admitting: Physician Assistant

## 2021-01-21 ENCOUNTER — Emergency Department (HOSPITAL_COMMUNITY)
Admission: EM | Admit: 2021-01-21 | Discharge: 2021-01-21 | Disposition: A | Discharge status: Home / Self Care | Payer: No Typology Code available for payment source | Attending: Emergency Medicine | Admitting: Emergency Medicine

## 2021-01-21 ENCOUNTER — Other Ambulatory Visit: Payer: Self-pay

## 2021-01-21 DIAGNOSIS — R1084 Generalized abdominal pain: Secondary | ICD-10-CM

## 2021-01-21 DIAGNOSIS — R110 Nausea: Secondary | ICD-10-CM | POA: Diagnosis not present

## 2021-01-21 DIAGNOSIS — R1010 Upper abdominal pain, unspecified: Secondary | ICD-10-CM | POA: Diagnosis not present

## 2021-01-21 DIAGNOSIS — K589 Irritable bowel syndrome without diarrhea: Secondary | ICD-10-CM

## 2021-01-21 HISTORY — DX: Irritable bowel syndrome, unspecified: K58.9

## 2021-01-21 LAB — COMPREHENSIVE METABOLIC PANEL
ALT: 28 U/L (ref 0–44)
AST: 20 U/L (ref 15–41)
Albumin: 4 g/dL (ref 3.5–5.0)
Alkaline Phosphatase: 67 U/L (ref 38–126)
Anion gap: 11 (ref 5–15)
BUN: 14 mg/dL (ref 6–20)
CO2: 26 mmol/L (ref 22–32)
Calcium: 9.4 mg/dL (ref 8.9–10.3)
Chloride: 104 mmol/L (ref 98–111)
Creatinine, Ser: 0.76 mg/dL (ref 0.44–1.00)
GFR, Estimated: >60 mL/min (ref >60)
Glucose, Bld: 123 mg/dL — ABNORMAL HIGH (ref 70–99)
Potassium: 4.1 mmol/L (ref 3.5–5.1)
Sodium: 141 mmol/L (ref 135–145)
Total Bilirubin: 0.5 mg/dL (ref 0.3–1.2)
Total Protein: 7.1 g/dL (ref 6.5–8.1)

## 2021-01-21 LAB — URINALYSIS, ROUTINE W REFLEX MICROSCOPIC
Bilirubin Urine: NEGATIVE
Glucose, UA: >=500 mg/dL — AB
Ketones, ur: 40 mg/dL — AB
Leukocytes,Ua: NEGATIVE
Nitrite: NEGATIVE
Protein, ur: NEGATIVE mg/dL
Specific Gravity, Urine: 1.015 (ref 1.005–1.030)
pH: 6 (ref 5.0–8.0)

## 2021-01-21 LAB — CBC WITH DIFFERENTIAL/PLATELET
Abs Immature Granulocytes: 0.03 10*3/uL (ref 0.00–0.07)
Basophils Absolute: 0 10*3/uL (ref 0.0–0.1)
Basophils Relative: 1 %
Eosinophils Absolute: 0.2 10*3/uL (ref 0.0–0.5)
Eosinophils Relative: 2 %
HCT: 47.3 % — ABNORMAL HIGH (ref 36.0–46.0)
Hemoglobin: 15.4 g/dL — ABNORMAL HIGH (ref 12.0–15.0)
Immature Granulocytes: 0 %
Lymphocytes Relative: 18 %
Lymphs Abs: 1.5 10*3/uL (ref 0.7–4.0)
MCH: 30.6 pg (ref 26.0–34.0)
MCHC: 32.6 g/dL (ref 30.0–36.0)
MCV: 93.8 fL (ref 80.0–100.0)
Monocytes Absolute: 0.5 10*3/uL (ref 0.1–1.0)
Monocytes Relative: 6 %
Neutro Abs: 6.1 10*3/uL (ref 1.7–7.7)
Neutrophils Relative %: 73 %
Platelets: 327 10*3/uL (ref 150–400)
RBC: 5.04 MIL/uL (ref 3.87–5.11)
RDW: 12.5 % (ref 11.5–15.5)
WBC: 8.4 10*3/uL (ref 4.0–10.5)
nRBC: 0 % (ref 0.0–0.2)

## 2021-01-21 LAB — URINALYSIS, MICROSCOPIC (REFLEX)
Bacteria, UA: NONE SEEN
RBC / HPF: >50 RBC/hpf (ref 0–5)

## 2021-01-21 LAB — I-STAT BETA HCG BLOOD, ED (MC, WL, AP ONLY): I-stat hCG, quantitative: <5 m[IU]/mL (ref <5)

## 2021-01-21 LAB — LIPASE, BLOOD: Lipase: 68 U/L — ABNORMAL HIGH (ref 11–51)

## 2021-01-21 MED ORDER — IOHEXOL 350 MG/ML SOLN
80.0000 mL | Freq: Once | INTRAVENOUS | Status: AC | PRN
  Administered 2021-01-21: 80 mL via INTRAVENOUS

## 2021-01-21 NOTE — ED Provider Notes (Signed)
[facility] EMERGENCY DEPARTMENT Provider Note   CSN: [id number] Arrival date & time: 01/21/21  1135     History  Chief Complaint  Patient presents with   Abdominal Pain    Jasmine Logan is a 46 y.o. female.  HPI Patient presents with her husband assists with history.  She presents 1 day after being seen and evaluated the [employer] for new patient visit.  Following that visit she was made aware of abnormal lab results, encouraged to come to the emergency department for evaluation. She notes ongoing fatigue beyond what she expects is normal, and nausea, unexpected weight gain.  History is notable for possible diagnosis of fibromyalgia in the distant past, and recent initiation of insulin. No vomiting, no diarrhea, no fever.  Pain when present is in the upper abdomen.  She was made aware of abnormal lipase value, greater than 1000 yesterday, sent here for evaluation.    Home Medications Prior to Admission medications   Medication Sig Start Date End Date Taking? Authorizing Provider  busPIRone (BUSPAR) 5 MG tablet Take 5 mg by mouth as needed.    [provider]  Liraglutide (VICTOZA) 18 MG/3ML SOPN Inject 1.8 mg into the skin daily.    [provider]  ondansetron (ZOFRAN) 4 MG tablet Take 4 mg by mouth every 8 (eight) hours as needed for nausea or vomiting.    [provider]      Allergies    Metformin, Ozempic (0.25 or 0.5 mg-dose) [semaglutide(0.25 or 0.5mg -dos)], Gluten meal, Empagliflozin, and Liraglutide    Review of Systems   Review of Systems  Constitutional:        Per HPI, otherwise negative  HENT:         Per HPI, otherwise negative  Respiratory:         Per HPI, otherwise negative  Cardiovascular:        Per HPI, otherwise negative  Gastrointestinal:  Negative for vomiting.  Endocrine:       Negative aside from HPI  Genitourinary:        Neg aside from HPI   Musculoskeletal:        Per HPI,  otherwise negative  Skin: Negative.   Neurological:  Negative for syncope.   Physical Exam Updated Vital Signs BP 139/77 (BP Location: Left Leg)    Pulse (!) 106    Temp 99.3 F (37.4 C) (Oral)    Resp 16    SpO2 100%  Physical Exam Vitals and nursing note reviewed.  Constitutional:      General: She is not in acute distress.    Appearance: She is well-developed.  HENT:     Head: Normocephalic and atraumatic.  Eyes:     Conjunctiva/sclera: Conjunctivae normal.  Cardiovascular:     Rate and Rhythm: Normal rate and regular rhythm.  Pulmonary:     Effort: Pulmonary effort is normal. No respiratory distress.     Breath sounds: Normal breath sounds. No stridor.  Abdominal:     General: There is no distension.  Skin:    General: Skin is warm and dry.  Neurological:     Mental Status: She is alert and oriented to person, place, and time.     Cranial Nerves: No cranial nerve deficit.    ED Results / Procedures / Treatments   Labs (all labs ordered are listed, but only abnormal results are displayed) Labs Reviewed  LIPASE, BLOOD - Abnormal; Notable for the following components:  Result Value   Lipase 68 (*)    All other components within normal limits  COMPREHENSIVE METABOLIC PANEL - Abnormal; Notable for the following components:   Glucose, Bld 123 (*)    All other components within normal limits  URINALYSIS, ROUTINE W REFLEX MICROSCOPIC - Abnormal; Notable for the following components:   Glucose, UA >=500 (*)    Hgb urine dipstick LARGE (*)    Ketones, ur 40 (*)    All other components within normal limits  CBC WITH DIFFERENTIAL/PLATELET - Abnormal; Notable for the following components:   Hemoglobin 15.4 (*)    HCT 47.3 (*)    All other components within normal limits  URINALYSIS, MICROSCOPIC (REFLEX)  I-STAT BETA HCG BLOOD, ED ([facility], [facility], [facility] ONLY)    EKG None  Radiology No results found.  Procedures Procedures    Medications Ordered in ED Medications - No  data to display  ED Course/ Medical Decision Making/ A&P  2:56 PM Patient aware of all labs, including a lipase that is elevated, slightly above normal, but not consistent with value reportedly yesterday of 20 times normal. Patient's other labs reviewed, discussed, reassuring.  Patient presents with ongoing nausea, early CAD, weight gain, in the context of recent initiation of insulin and possible prior diagnosis of fibromyalgia.  Here she is awake, alert, with a nonperitoneal abdomen, no fever, however, given her concerns, reported abnormalities, differential including acute pancreatitis versus other intra-abdominal processes, mass, infection or electrolyte abnormalities all considered, initial findings reassuring as above.  On signout the patient is awaiting CT scan.  If this is unremarkable patient may be appropriate for follow-up with her [employer] primary care physician.                         Medical Decision Making Amount and/or Complexity of Data Reviewed Labs: ordered. Radiology: ordered.         Final Clinical Impression(s) / ED Diagnoses Final diagnoses:  Nausea     Gerhard Munch, MD 01/21/21 1458

## 2021-01-21 NOTE — Discharge Instructions (Addendum)
As discussed, your evaluation today has been largely reassuring.  But, it is important that you monitor your condition carefully, and do not hesitate to return to the ED if you develop new, or concerning changes in your condition.  Otherwise, please follow-up with your physician for appropriate ongoing care.  Your CT results from today are included below:  COMPARISON:  01/02/2014   CT FINDINGS: Lower chest: No acute abnormality.   Hepatobiliary: Posterior right liver lobulated hypodense mass with peripheral nodular enhancement. With delayed imaging there is further nodular enhancement. Lesion now measures 6.4 x 5.4 by 7.4 cm, previously 3.6 x 3.2 x 4.6 cm. 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 No CT evidence of acute hemorrhage. No Perihepatic free fluid or hematoma.   No other significant hepatic abnormality. No biliary dilatation. Hepatic and portal veins are patent. Gallbladder unremarkable. Common bile duct nondilated.   Pancreas: Unremarkable. No pancreatic ductal dilatation or surrounding inflammatory changes.   Spleen: Normal in size without focal abnormality.   Adrenals/Urinary Tract: Adrenal glands are unremarkable. Kidneys are normal, without renal calculi, focal lesion, or hydronephrosis. Bladder is unremarkable.   Stomach/Bowel: Stomach is within normal limits. Appendix appears normal. No evidence of bowel wall thickening, distention, or inflammatory changes.   Vascular/Lymphatic: Minimal distal aortic bifurcation atherosclerosis. Negative for aneurysm. No occlusive process or acute finding. Mesenteric and renal vasculature all appear patent. No veno-occlusive process. Negative for adenopathy.   Reproductive: Left lower uterine segment rounded enhancing uterine mass measures 5.2 cm compatible with a lower uterine segment fibroid. Tubal ligation clips noted. Ovaries are normal in size. Left adnexal stable 2.3 cm cyst again noted,  query broad ligament cyst. No other pelvic abnormality. No free fluid.   Other: No abdominal wall hernia or abnormality. No abdominopelvic ascites.   Musculoskeletal: No acute or significant osseous findings.

## 2021-01-21 NOTE — ED Provider Triage Note (Signed)
Emergency Medicine Provider Triage Evaluation Note  Jasmine Logan , a 46 y.o. female  was evaluated in triage.  Pt complains of elevated lipase. She was seen at the [place] yesterday to establish care.  She was told that her lipase was significantly elevated at 1881 and referred to the emergency room. She states that she has intermittently been having abdominal pain for "all my life."  She reports that she has IBS.  She notes that last night when eating after dinner she did develop upper abdominal pain and became very nauseated which is abnormal for her.  No fevers.  She is just recently been feeling globally unwell. She had a ultrasound on the liver in December at the [place].  She has not had any CT imaging.  Review of Systems  Positive: See above Negative:   Physical Exam  BP 139/77 (BP Location: Left Leg)    Pulse (!) 106    Temp 99.3 F (37.4 C) (Oral)    Resp 16    SpO2 100%  Gen:   Awake, no distress   Resp:  Normal effort  MSK:   Moves extremities without difficulty  Other:  Abdomen is soft, non tender non distended.   Medical Decision Making  Medically screening exam initiated at 12:28 PM.  Appropriate orders placed.  Jasmine Logan was informed that the remainder of the evaluation will be completed by another provider, this initial triage assessment does not replace that evaluation, and the importance of remaining in the ED until their evaluation is complete.  Will repeat labs to confirm abnormal and evaluate for change.   Note: Portions of this report may have been transcribed using voice recognition software. Every effort was made to ensure accuracy; however, inadvertent computerized transcription errors may be present.   Cristina Gong, [place] 01/21/21 1232

## 2021-01-21 NOTE — ED Triage Notes (Signed)
Pt complaining of intermittent abdominal pain. Pt also reports, going to PCP related to possible fibro dx, lipase with annual labs was 1881   HX: IBS, PCOS, DM2

## 2021-12-10 ENCOUNTER — Other Ambulatory Visit (HOSPITAL_COMMUNITY): Payer: Self-pay | Admitting: INTERNAL MEDICINE

## 2021-12-10 DIAGNOSIS — D1800 Hemangioma unspecified site: Secondary | ICD-10-CM

## 2022-01-16 ENCOUNTER — Inpatient Hospital Stay
Admission: RE | Admit: 2022-01-16 | Discharge: 2022-01-16 | Disposition: A | Discharge status: Home / Self Care | Payer: 59 | Source: Ambulatory Visit | Attending: INTERNAL MEDICINE | Admitting: INTERNAL MEDICINE

## 2022-01-16 ENCOUNTER — Other Ambulatory Visit: Payer: Self-pay

## 2022-01-16 DIAGNOSIS — D1800 Hemangioma unspecified site: Secondary | ICD-10-CM | POA: Insufficient documentation

## 2022-01-16 MED ORDER — GADOBUTROL 10 MMOL/10 ML (1 MMOL/ML) INTRAVENOUS SOLUTION
9.5000 mL | INTRAVENOUS | Status: AC
Start: 2022-01-16 — End: 2022-01-16
  Administered 2022-01-16: 9.5 mL via INTRAVENOUS

## 2022-01-16 MED ORDER — DIPHENHYDRAMINE 50 MG CAPSULE
50.0000 mg | ORAL_CAPSULE | ORAL | Status: DC
Start: 2022-01-16 — End: 2022-01-17

## 2022-01-16 NOTE — Nurses Notes (Signed)
RN called to scan table by MRI tech. Patient complaining of itchiness in her left ear and throat as well as nasal congestion that wasn't previously present (according to patient report). O2 96% RA, HR 106. No SOB, chest pain, etc noted by patient. Dr. Vista Lawman called to assess patient. Verbal order received from Dr. Vista Lawman to administer 50 mg Benadryl PO.     Vitals obtained q5 for 15 mins.   HR 99-107  BP 118-120/50-70  O2 95-97%     Letta Median, RN

## 2022-01-16 NOTE — Consults (Signed)
DIAGNOSTIC RADIOLOGY  CONTRAST REACTION NOTE      Patient Name:   Lynn Gross   Patient MRN:   [id number]  Patient DOB:   [birth year]  Date of Service:  01/16/2022  Consult requested by:  No service for patient encounter.      Contrast type:  Gadolinium  Type of Reaction: Physiologic   Severity of Reaction: Mild  Pre-medication administered:No,     Progress Note:    Radiology resident on-call was paged by MRI technician for management of contrast reaction on 01/16/22 at 15:23. Patient was was undergoing MR of the abdomen.     On encounter, patient was sitting on the MR table in no acute distress. Primary survey completed. Vital signs stable. Technologist reported that the patient seemed anxious and flushed during the exam. Patient reported feeling stuffy with pruritus extending her left lateral neck and left ear lobe which lasted for a few minutes and resolved spontaneously. Physical exam demonstrated erythema along the chest and neck soft tissues. No rash was appreciated.       Plan:    Outpatient  Management plan discussed with patient which included 50 mg of po benadryl. Patient advised to stay in the imaging suite for next 15-30 minutes, if their condition improves or stays the same, they can be sent home. Discussed signs and symptoms that would be concerning for serious/worsening reaction with patient. They verbalized their understanding that if these occur they should 911 or visit their local ED immediately.     Lestine Box, DO    01/16/2022, 15:23            Refer to ACR NIS document for severity and type of reaction:   LINK: [email or web address]          Management Algorithm for reference by ACR

## 2022-01-18 DIAGNOSIS — K760 Fatty (change of) liver, not elsewhere classified: Secondary | ICD-10-CM

## 2022-01-18 DIAGNOSIS — D1803 Hemangioma of intra-abdominal structures: Secondary | ICD-10-CM

## 2022-01-18 DIAGNOSIS — D3500 Benign neoplasm of unspecified adrenal gland: Secondary | ICD-10-CM

## 2022-03-18 ENCOUNTER — Encounter (INDEPENDENT_AMBULATORY_CARE_PROVIDER_SITE_OTHER): Payer: Self-pay | Admitting: Primary Care

## 2022-03-18 ENCOUNTER — Other Ambulatory Visit: Payer: Self-pay

## 2022-03-18 ENCOUNTER — Ambulatory Visit (INDEPENDENT_AMBULATORY_CARE_PROVIDER_SITE_OTHER): Payer: 59 | Admitting: Primary Care

## 2022-03-18 VITALS — BP 126/78 | HR 100 | Temp 97.0°F | Ht 65.0 in | Wt 220.9 lb

## 2022-03-18 DIAGNOSIS — F411 Generalized anxiety disorder: Secondary | ICD-10-CM | POA: Insufficient documentation

## 2022-03-18 DIAGNOSIS — N852 Hypertrophy of uterus: Secondary | ICD-10-CM

## 2022-03-18 NOTE — Progress Notes (Signed)
Department of Obstetrics & Gynecology      Name: Lynn Gross  MRN: [id number]  Date: 03/18/22    Subjective:     Lynn Gross is a very pleasant 47 y.o. [phone] presenting today for consult for abnormal findings on a transvaginal ultrasound performed at [facility] for the [facility].  Patient states she was informed she had fibroids, thickened endometrium, and ovarian cyst.  The report she received at not give specifics as to the fibroids or the thickness of the endometrium or the content or character of the ovarian cyst.  The current report is not available in epic and patient will sign a medical records release to obtain radiology reports from the [facility].  And MRI imaging report is available in epic dated from January 16, 2022.  Patient states she also has difficulty with urinary stream as being very slow and difficulty emptying her bladder.  Patient states it feels like something is obstructing the outflow.  She does state that she has had diagnostic testing of the bladder and no abnormalities were found.  Patient will also need to sign a medical records release for [place] to obtain this report as well.  Patient has a past medical history of PCOS, diabetes with insulin treatment, fibromyalgia which is managed with Effexor, irritable bowel syndrome controlled with Bentyl, generalized anxiety disorder also controlled with Effexor.  She states she has a history of elevated lipids which she currently takes atorvastatin 5 mg daily.  She is also taking vitamin B12 a 1000 mcg for her fibromyalgia.  And uses ibuprofen as needed for pain.    HPI   LMP: Patient's last menstrual period was 03/02/2022 (exact date).   Normal: Yes   Last Pap: 12/2020, pt has a hx of abnormal paps with colposcopy      Results: normal  Recent history of STI: No   Concern for exposure: No  HPI   Patient Active Problem List    Diagnosis Date Noted    GAD (generalized anxiety  disorder) 03/18/2022     Current Outpatient Medications   Medication Sig    atorvastatin (LIPITOR) 10 mg Oral Tablet 0.5 Tablets (5 mg total)    cyanocobalamin (VITAMIN B 12) 1,000 mcg Oral Tablet 1 Tablet (1,000 mcg total)    dapagliflozin propanediol (FARXIGA) 10 mg Oral Tablet Take 1 Tablet (10 mg total) by mouth Once a day    dicyclomine (BENTYL) 10 mg Oral Capsule 1 Capsule (10 mg total)    Ibuprofen (MOTRIN) 600 mg Oral Tablet Take 1 Tablet (600 mg total) by mouth Four times a day as needed for Pain    insulin glargine-yfgn 100 Units/mL Subcutaneous Insulin Pen Inject 38 Units under the skin Once a day    ondansetron (ZOFRAN) 4 mg Oral Tablet Take 1 Tablet (4 mg total) by mouth Once per day as needed    venlafaxine (EFFEXOR XR) 37.5 mg Oral Capsule, Sust. Release 24 hr 1 Capsule (37.5 mg total)     Allergies   Allergen Reactions    Metformin Diarrhea    Duloxetine  Other Adverse Reaction (Add comment)    Trulicity [Dulaglutide]     Empagliflozin  Other Adverse Reaction (Add comment)    Iodinated Contrast Media Itching       Past Medical History  Past Medical History:   Diagnosis Date    Diabetes mellitus (CMS HCC)     Fibromyalgia     Irritable bowel syndrome  PCOS (polycystic ovarian syndrome)          Past Surgical History:   Procedure Laterality Date    HX ACL RECONSTRUCTION Right     HX CESAREAN SECTION      HX TUBAL LIGATION      KNEE CARTILAGE SURGERY Right          Family Medical History:    None         Review of Systems   Constitutional:  Negative for chills and fever.   Respiratory:  Negative for cough, shortness of breath and wheezing.    Cardiovascular:  Negative for chest pain, palpitations and leg swelling.   Gastrointestinal:  Positive for abdominal pain, constipation, diarrhea, heartburn and nausea. Negative for blood in stool and vomiting.   Genitourinary:  Negative for dysuria, flank pain, frequency and urgency.        Incomplete bladder emptying   Musculoskeletal:  Positive for joint  pain.   Skin:         Tattoos - R thigh, L breast, L forearm, bilateral hands, neck, lower back   Neurological: Negative.    Psychiatric/Behavioral:  Negative for depression and suicidal ideas. The patient is nervous/anxious.       Objective:     BP 126/78 (Site: Left, Patient Position: Sitting, Cuff Size: Adult)   Pulse 100   Temp 36.1 C (97 F) (Thermal Scan)   Ht 1.651 m (5\' 5" )   Wt 100 kg (220 lb 14.4 oz)   LMP 03/02/2022 (Exact Date)   SpO2 96%   BMI 36.76 kg/m         *Patient consented to exam. Ongoing consent ensured. Chaperone present for pelvic exam with patient's permission*    Physical Exam  Constitutional:       General: She is not in acute distress.     Appearance: Normal appearance. She is obese. She is not ill-appearing.   Genitourinary:      Rectum normal.      No lesions in the vagina.      Right Labia: No rash, tenderness, lesions, skin changes or Bartholin's cyst.     Left Labia: No tenderness, lesions, skin changes, Bartholin's cyst or rash.     No vaginal discharge, erythema or tenderness.      No vaginal prolapse present.     Mild vaginal atrophy present.       Right Adnexa: not tender, not palpable and no mass present.     Left Adnexa: not tender, not palpable and no mass present.     No cervical motion tenderness or discharge.      Cervical exam comments: Deviated to the left.      Uterus is enlarged and irregular.      Uterus is not tender.      Uterus exam comments: Uterus deviated to the left.      Urethral meatus caruncle not present.     Urethral meatus exam comments: The urethral meatus was difficult to identify..     No urethral tenderness, mass or discharge present.      Bladder is not tender.    Cardiovascular:      Rate and Rhythm: Normal rate.      Pulses: Normal pulses.   Pulmonary:      Effort: Pulmonary effort is normal.   Abdominal:      Tenderness: There is no abdominal tenderness. There is no right CVA tenderness or left CVA tenderness.   Neurological:  Mental  Status: She is alert and oriented to person, place, and time.   Skin:     General: Skin is warm and dry.   Psychiatric:         Behavior: Behavior normal.   Vitals reviewed. Exam conducted with a chaperone present.      MRI report 01/16/22 finding: Partially imaged bilateral adnexal cysts. Partially imaged uterine and endometrial masses     On the day of the encounter, a total of  52 minutes was spent on this patient encounter including review of historical information, examination, documentation and post-visit activities.     Assessment:     Assessment/Plan   1. GAD (generalized anxiety disorder)    2. Bulky or enlarged uterus      Up to date 2022 on cervical cytology    Plan:   Patient education provided verbally and handouts provided via printout or MyChart.  Patient education provided on uterine fibroids, myomectomy, endometrial biopsy, and perimenopausal symptoms.  Patient is sign medical records release for [place] to obtain records from the [facility] and diagnostic imaging reports.  Upon review of those reports patient will then be referred to MD for consult for surgical intervention or biopsy or additional diagnostics will be ordered.  For patient's complaint of difficulty with urine stream and inability to empty bladder, past records required to evaluate findings from bladder diagnostics.  Patient has signed an additional medical records release to obtain this.  Pending receipt of this information a consult with uro Gyne we will be obtained.  Preventive health screening discussed  Prescription medication management reviewed and no change  Follow up for [facility] or PRN    Melvern Banker, APRN, WHNP-BC

## 2022-03-19 ENCOUNTER — Encounter (INDEPENDENT_AMBULATORY_CARE_PROVIDER_SITE_OTHER): Payer: Self-pay | Admitting: Primary Care

## 2022-03-19 ENCOUNTER — Ambulatory Visit (INDEPENDENT_AMBULATORY_CARE_PROVIDER_SITE_OTHER): Payer: Self-pay | Admitting: Primary Care

## 2022-03-19 NOTE — Telephone Encounter (Signed)
-----   Message from [place] sent at 03/19/2022  9:01 AM EDT -----  Salley Hews pt     Vicente Males from the [place] is asking what imaging is being needed for this patient? She states she was looking at the doctors notes from the apt from yesterday, and saw that she was requesting imaging. She states they faxed over previous abdominal MRI for this pt. Please advise.    Thank you,  Verdis Frederickson

## 2022-03-24 ENCOUNTER — Other Ambulatory Visit (INDEPENDENT_AMBULATORY_CARE_PROVIDER_SITE_OTHER): Payer: Self-pay | Admitting: Primary Care

## 2022-03-26 ENCOUNTER — Encounter (INDEPENDENT_AMBULATORY_CARE_PROVIDER_SITE_OTHER): Payer: Self-pay | Admitting: Primary Care

## 2022-03-29 ENCOUNTER — Encounter (HOSPITAL_COMMUNITY): Payer: Self-pay

## 2022-04-12 ENCOUNTER — Encounter (INDEPENDENT_AMBULATORY_CARE_PROVIDER_SITE_OTHER): Payer: Self-pay | Admitting: Primary Care

## 2022-04-14 ENCOUNTER — Other Ambulatory Visit (HOSPITAL_COMMUNITY): Payer: Self-pay | Admitting: INTERNAL MEDICINE

## 2022-04-14 DIAGNOSIS — R202 Paresthesia of skin: Secondary | ICD-10-CM

## 2022-05-11 ENCOUNTER — Ambulatory Visit (INDEPENDENT_AMBULATORY_CARE_PROVIDER_SITE_OTHER): Payer: 59 | Admitting: OBSTETRICS/GYNECOLOGY

## 2022-05-11 ENCOUNTER — Other Ambulatory Visit: Payer: Self-pay

## 2022-05-11 ENCOUNTER — Ambulatory Visit: Payer: 59 | Attending: OBSTETRICS/GYNECOLOGY | Admitting: OBSTETRICS/GYNECOLOGY

## 2022-05-11 ENCOUNTER — Encounter (INDEPENDENT_AMBULATORY_CARE_PROVIDER_SITE_OTHER): Payer: Self-pay | Admitting: OBSTETRICS/GYNECOLOGY

## 2022-05-11 VITALS — BP 110/78 | HR 108 | Ht 65.0 in | Wt 220.5 lb

## 2022-05-11 DIAGNOSIS — Z124 Encounter for screening for malignant neoplasm of cervix: Secondary | ICD-10-CM | POA: Insufficient documentation

## 2022-05-11 DIAGNOSIS — N852 Hypertrophy of uterus: Secondary | ICD-10-CM

## 2022-05-11 DIAGNOSIS — D259 Leiomyoma of uterus, unspecified: Secondary | ICD-10-CM

## 2022-05-11 NOTE — Progress Notes (Signed)
Progress Note      Chief Complaint:  This is a 47 y.o. White female who is a [id number] that presents for concerns with an enlarged fibroid uterus.    HPI:  [name], gravida 3 para 2012 with most recent menstrual cycle on Journiee 22, 2024 who has been known to have an enlarged fibroid uterus.  She has also been told that there is some urinary/dysfunction possible outflow restriction with a fibroids but she has never been given specific information other than told that she should see a gynecologist by her physicians at the [facility].  She has not had a recent ultrasound and the MRI in January revealed an enlarged uterus with multiple fibroids.  Most recent Pap was unsatisfactory so she would like to have a repeat Pap today along with further evaluation and consideration for therapy for the fibroids.      Past Medical History:  All were reviewed and updated in the EMR.  Past Surgical History:   Procedure Laterality Date    HX ACL RECONSTRUCTION Right     HX CESAREAN SECTION      HX TUBAL LIGATION      KNEE CARTILAGE SURGERY Right           Past Medical History:   Diagnosis Date    Diabetes mellitus (CMS HCC)     Fibromyalgia     Irritable bowel syndrome     PCOS (polycystic ovarian syndrome)          Current Outpatient Medications   Medication Sig    atorvastatin (LIPITOR) 10 mg Oral Tablet 0.5 Tablets (5 mg total)    cyanocobalamin (VITAMIN B 12) 1,000 mcg Oral Tablet 1 Tablet (1,000 mcg total)    dapagliflozin propanediol (FARXIGA) 10 mg Oral Tablet Take 1 Tablet (10 mg total) by mouth Once a day    dicyclomine (BENTYL) 10 mg Oral Capsule 1 Capsule (10 mg total)    glipiZIDE (GLUCOTROL) 5 mg Oral Tablet 1 Tablet (5 mg total)    Ibuprofen (MOTRIN) 600 mg Oral Tablet Take 1 Tablet (600 mg total) by mouth Four times a day as needed for Pain    insulin glargine-yfgn 100 Units/mL Subcutaneous Insulin Pen Inject 38 Units under the skin Once a day    ondansetron (ZOFRAN) 4 mg  Oral Tablet Take 1 Tablet (4 mg total) by mouth Once per day as needed    venlafaxine (EFFEXOR XR) 37.5 mg Oral Capsule, Sust. Release 24 hr 1 Capsule (37.5 mg total)     Allergies   Allergen Reactions    Metformin Diarrhea    Bactrim [Sulfamethoxazole-Trimethoprim]     Duloxetine  Other Adverse Reaction (Add comment)    Trulicity [Dulaglutide]     Empagliflozin  Other Adverse Reaction (Add comment)    Iodinated Contrast Media Itching       Ob/Gyn Hx:    OB History       Gravida   3    Para   2    Term   2    Preterm        AB   1    Living   2         SAB   1    IAB        Ectopic        Multiple        Live Births   2  Social History     Substance and Sexual Activity   Sexual Activity Yes    Partners: Male    Birth control/protection: Female Sterilization       Social History:  Social History     Tobacco Use    Smoking status: Former     Current packs/day: 0.00     Types: Cigarettes     Quit date: 2009     Years since quitting: 15.3    Smokeless tobacco: Never   Vaping Use    Vaping status: Never Used   Substance Use Topics    Alcohol use: Never    Drug use: Never         Family History:  Family Medical History:    None           ROS: normal menses, no abnormal bleeding, pelvic pain or discharge, no breast pain or new or enlarging lumps on self exam  Constitutional:  No unexplained weight gain or loss, no loss of appetite, no fever, no night sweats or chills, no pain in jaws when eating, no scalp tenderness,  Ears nose mouth and throat:  No difficulty with hearing, no sinus problems, no runny nose, no postnasal drip, no ringing ears, no mouth sores, loose teeth, ear pain nose bleeds, sore throat facial pain or numbness.  Cardiovascular no irregular heart beats, racing heartbeat, chest pains, swelling of feet or legs, no pain in legs with walking.  Respiratory no shortness of breath, no night sweats, no prolonged cough, wheezing, sputum production, no hemoptysis.  Gastrointestinal no heartburn,  constipation, intolerance to foods, diarrhea abdominal pain, difficulty swallowing, nausea, vomiting, and no change in bowel movements.  Integument:  No rash, itching, skin lesions, change in hair.  Neurologic no frequent headaches, double vision, weakness, changes in station, problems with walking or balance, dizziness, tremor, loss of consciousness no visual loss.  Psychiatric:  No insomnia, irritability, depression, anxiety, mood swings, hallucinations.  Endocrine no intolerance to heat and cold, no frequent hunger, urination, thirst.  No changes sex drive.  Hematologic no easy bleeding, easy bruising, anemia, abnormal swollen areas.  Allergy/immunology:  No seasonal allergies, hay fever, itching, frequent infections, no exposure to HIV.    Physical Examination:  Vitals:    05/11/22 1405   BP: 110/78   Pulse: (!) 108   SpO2: 96%   Weight: 100 kg (220 lb 7.4 oz)   Height: 1.651 m (5\' 5" )   BMI: 36.76         Body mass index is 36.69 kg/m.      General: no acute distress  Constitutional:  Well developed, well nourished, alert and oriented x4  Skin: intact, without lesions  HEENT: NCAT, EOMI  Abdomen: soft, NT, ND, no masses  Neurologic: alert and oriented, no focal deficits noted  Psychiatric: mood stable, normal affect, alert and oriented x4    Gynecologic:      External Genitalia: without lesions  Urethra: no lesions  Vulva: no lesions  Vagina:  Normal rugae without discharge  Cervix::  Multiparous without lesion  Uterus:  12 week size when compared to the pregnant uterus on bimanual examination, irregular in shape  Adnexa:  Nontender without palpable mass due to enlarged uterus        Examination for a 47 y.o. female who presents with enlarged leiomyomatous uterus.  At this point will perform an ultrasound for further evaluation prior to consideration for therapy we reviewed the options for surgical medical gland hormonal therapy  to help the patient with her symptoms as well as the possibility of uterine artery  embolization.  She has not interested in myomectomy since she does not desire any further childbearing and also would be interested in medical and/or hormonal therapy if she could avoid surgery.  Will discuss this further when we obtain the ultrasound results      ICD-10-CM    1. Uterine leiomyoma, unspecified location  D25.9 [place] [address] TRANSVAGINAL GYN ( TV)      2. Bulky or enlarged uterus  N85.2 OBG US 76830 TRANSVAGINAL GYN ( TV)      3. Screening for cervical cancer  Z12.4 CYTOPATHOLOGY, GYN +/- HIGH RISK HPV           Orders Placed This Encounter    [place] [address] TRANSVAGINAL GYN ( TV)    CYTOPATHOLOGY, GYN +/- HIGH RISK HPV     Follow-up with ultrasound    Delmar Landau, MD

## 2022-05-14 LAB — CYTOPATHOLOGY, GYN +/- HIGH RISK HPV

## 2022-05-16 LAB — HUMAN PAPILLOMA VIRUS (HPV) BY PCR WITH HIGH RISK GENOTYPING (THINPREP)
HPV OTHER: NEGATIVE
HPV16 PCR: NEGATIVE
HPV18 PCR: NEGATIVE

## 2022-06-17 ENCOUNTER — Ambulatory Visit (INDEPENDENT_AMBULATORY_CARE_PROVIDER_SITE_OTHER): Payer: Self-pay | Admitting: Primary Care

## 2022-06-18 ENCOUNTER — Inpatient Hospital Stay
Admission: RE | Admit: 2022-06-18 | Discharge: 2022-06-18 | Disposition: A | Discharge status: Home / Self Care | Payer: 59 | Source: Ambulatory Visit | Attending: INTERNAL MEDICINE | Admitting: INTERNAL MEDICINE

## 2022-06-18 ENCOUNTER — Other Ambulatory Visit: Payer: Self-pay

## 2022-06-18 DIAGNOSIS — R202 Paresthesia of skin: Secondary | ICD-10-CM | POA: Insufficient documentation

## 2022-06-21 ENCOUNTER — Other Ambulatory Visit (INDEPENDENT_AMBULATORY_CARE_PROVIDER_SITE_OTHER): Payer: 59

## 2022-06-21 ENCOUNTER — Other Ambulatory Visit: Payer: Self-pay

## 2022-06-21 DIAGNOSIS — D259 Leiomyoma of uterus, unspecified: Secondary | ICD-10-CM

## 2022-06-21 DIAGNOSIS — N888 Other specified noninflammatory disorders of cervix uteri: Secondary | ICD-10-CM

## 2022-06-21 DIAGNOSIS — N852 Hypertrophy of uterus: Secondary | ICD-10-CM

## 2022-06-28 ENCOUNTER — Encounter (INDEPENDENT_AMBULATORY_CARE_PROVIDER_SITE_OTHER): Payer: Self-pay

## 2022-06-28 ENCOUNTER — Telehealth (INDEPENDENT_AMBULATORY_CARE_PROVIDER_SITE_OTHER): Payer: Self-pay | Admitting: OBSTETRICS/GYNECOLOGY

## 2022-06-28 NOTE — Telephone Encounter (Signed)
Pt agreeable to scheduling 6/25 for results review. Pt requested alternate appt with Mercie Eon be cancelled and will follow with Dr. Titus Dubin for her care.  Sara Chu, RN, 06/28/2022 16:04

## 2022-06-28 NOTE — Telephone Encounter (Signed)
-----   Message from Delmar Landau, MD sent at 06/28/2022  3:25 PM EDT -----  Schedule a video visit so we can review these findings and discuss next steps for evaluation of this patient.

## 2022-06-28 NOTE — Telephone Encounter (Signed)
Pt set to front desk to schedule a video visit per Dr. Tharon Aquas, MD  P [facility] Ob/Gyn Clinical Support Staff  Schedule a video visit so we can review these findings and discuss next steps for evaluation of this patient    Lynn Gross

## 2022-06-29 ENCOUNTER — Encounter (INDEPENDENT_AMBULATORY_CARE_PROVIDER_SITE_OTHER): Payer: Self-pay | Admitting: OBSTETRICS/GYNECOLOGY

## 2022-06-29 ENCOUNTER — Telehealth: Payer: 59 | Admitting: OBSTETRICS/GYNECOLOGY

## 2022-06-29 DIAGNOSIS — D251 Intramural leiomyoma of uterus: Secondary | ICD-10-CM

## 2022-06-29 DIAGNOSIS — N852 Hypertrophy of uterus: Secondary | ICD-10-CM

## 2022-06-29 DIAGNOSIS — D252 Subserosal leiomyoma of uterus: Secondary | ICD-10-CM

## 2022-06-29 DIAGNOSIS — D250 Submucous leiomyoma of uterus: Secondary | ICD-10-CM

## 2022-06-29 NOTE — Progress Notes (Signed)
Progress Note    TELEMEDICINE DOCUMENTATION:    Patient Location:  MyChart video visit from home address: [address] [place] [address] [place] [address]    Patient/family aware of provider location:  yes  Patient/family consent for telemedicine:  yes  Examination observed and performed by:  Delmar Landau, MD       Chief Complaint:  This is a 47 y.o. White female who is a [id number] that presents for follow-up with her symptoms of pelvic pressure and difficulty with bladder emptying and known enlarged fibroid uterus.    HPI:  The patient is a [name], gravida 3 para 2012 whose last normal menstrual period was on June 19, 2022.  The patient has a known enlarged fibroid uterus but has more recently had history with urinary dysfunction where she feels that she does not always completely empty her bladder and she has been evaluated previously by urologist who said that she also had evidence of outflow obstruction related to the uterine enlargement.  She notes that her cycles continue to be every 28-30 days lasting less than 7 days with moderate flow and occasional clots, she has no intermenstrual bleeding/breakthrough bleeding and no heavy or excessive bleeding.    An ultrasound was performed with a 11.99 x 7.08 x 6.09 cm fibroid uterus with multiple myomas the largest measuring 6.9 x 5.3 x 6.4 posteriorly along the body of the uterus and another posterior fundal fibroid measuring 4.3 x 4.3 x 3.2 cm.  There were several other 2-3 cm fibroids noted on the ultrasound examination.  Both ovaries were relatively normal in size but there was a cyst on the left ovary which was felt to either be an exophytic cyst or hydrosalpinx versus paratubal cyst.  She has no significant pain or discomfort in the left side.    The patient is very interested in definitive surgical therapy for these symptoms because of the bladder dysfunction\and symptoms of pelvic pressure.    Change in sexual partner:  None  Abnormal pap history:   Denies  Last pap:  May 11, 2022 cytology negative and HPV negative  STD history:  Denies  HRT Use:  None  Contraception:  Tubal ligation    Past Medical History:  All were reviewed and updated in the EMR.  Past Surgical History:   Procedure Laterality Date    HX ACL RECONSTRUCTION Right     HX CESAREAN SECTION      HX TUBAL LIGATION      KNEE CARTILAGE SURGERY Right           Past Medical History:   Diagnosis Date    Diabetes mellitus (CMS HCC)     Fibromyalgia     Irritable bowel syndrome     PCOS (polycystic ovarian syndrome)          Current Outpatient Medications   Medication Sig    atorvastatin (LIPITOR) 10 mg Oral Tablet 0.5 Tablets (5 mg total)    cyanocobalamin (VITAMIN B 12) 1,000 mcg Oral Tablet 1 Tablet (1,000 mcg total)    dapagliflozin propanediol (FARXIGA) 10 mg Oral Tablet Take 1 Tablet (10 mg total) by mouth Once a day    dicyclomine (BENTYL) 10 mg Oral Capsule 1 Capsule (10 mg total)    glipiZIDE (GLUCOTROL) 5 mg Oral Tablet 1 Tablet (5 mg total)    Ibuprofen (MOTRIN) 600 mg Oral Tablet Take 1 Tablet (600 mg total) by mouth Four times a day as needed for Pain    insulin  glargine-yfgn 100 Units/mL Subcutaneous Insulin Pen Inject 38 Units under the skin Once a day    ondansetron (ZOFRAN) 4 mg Oral Tablet Take 1 Tablet (4 mg total) by mouth Once per day as needed    venlafaxine (EFFEXOR XR) 37.5 mg Oral Capsule, Sust. Release 24 hr 1 Capsule (37.5 mg total)     Allergies   Allergen Reactions    Metformin Diarrhea    Bactrim [Sulfamethoxazole-Trimethoprim]     Duloxetine  Other Adverse Reaction (Add comment)    Trulicity [Dulaglutide]     Empagliflozin  Other Adverse Reaction (Add comment)    Iodinated Contrast Media Itching       Ob/Gyn Hx:    OB History       Gravida   3    Para   2    Term   2    Preterm        AB   1    Living   2         SAB   1    IAB        Ectopic        Multiple        Live Births   2               Social History     Substance and Sexual Activity   Sexual Activity Yes     Partners: Male    Birth control/protection: Female Sterilization       Social History:  Social History     Tobacco Use    Smoking status: Former     Current packs/day: 0.00     Types: Cigarettes     Quit date: 2009     Years since quitting: 15.4    Smokeless tobacco: Never   Vaping Use    Vaping status: Never Used   Substance Use Topics    Alcohol use: Never    Drug use: Never       Family History:  Family Medical History:    None           ROS: normal menses, no abnormal bleeding or discharge, no breast pain or new or enlarging lumps on self exam  Constitutional:  No unexplained weight gain or loss, no loss of appetite, no fever, no night sweats or chills, no pain in jaws when eating, no scalp tenderness,  Ears nose mouth and throat:  No difficulty with hearing, no sinus problems, no runny nose, no postnasal drip, no ringing ears, no mouth sores, loose teeth, ear pain nose bleeds, sore throat facial pain or numbness.  Cardiovascular no irregular heart beats, racing heartbeat, chest pains, swelling of feet or legs, no pain in legs with walking.  Respiratory no shortness of breath, no night sweats, no prolonged cough, wheezing, sputum production, no hemoptysis.  Gastrointestinal no heartburn, constipation, intolerance to foods, diarrhea abdominal pain, difficulty swallowing, nausea, vomiting, and no change in bowel movements.  Integument:  No rash, itching, skin lesions, change in hair.  Neurologic no frequent headaches, double vision, weakness, changes in station, problems with walking or balance, dizziness, tremor, loss of consciousness no visual loss.  Psychiatric:  No insomnia, irritability, depression, anxiety, mood swings, hallucinations.  Endocrine no intolerance to heat and cold, no frequent hunger, urination, thirst.  No changes sex drive.  Hematologic no easy bleeding, easy bruising, anemia, abnormal swollen areas.  Allergy/immunology:  No seasonal allergies, hay fever, itching, frequent infections, no  exposure to  HIV.    Physical Examination:  There were no vitals filed for this visit.      There is no height or weight on file to calculate BMI.      General: no acute distress by inspection  Constitutional:  Well developed, well nourished, alert and oriented x4 by inspection  Skin: intact, without lesions by inspection  HEENT: NCAT, EOMI by inspection  Neurologic: alert and oriented, no focal deficits noted by inspection  Psychiatric: mood stable, normal affect, alert and oriented x4 by inspection    Gynecologic:    Deferred today      Examination for a 47 y.o. female who presents with lower urinary tract symptoms with possible outflow obstruction, pelvic pressure and a significantly enlarged and bulky fibroid uterus with intramural, submucous and subserous leiomyoma noted on her ultrasound.  At this point the patient has concluded her childbearing and is not having any significant menstrual symptoms but more symptoms of mass effect with her fibroids.  She is interested in surgical management for her therapy and desires to proceed with total laparoscopic hysterectomy, possible total abdominal hysterectomy, bilateral salpingectomy and possible left oophorectomy.  Since she has no longer considering childbearing myomectomy was not discussed in great detail and also based on the size of her largest fibroid uterine artery embolization was not reviewed.  She has not interested in any hormonal therapy at this time.      ICD-10-CM    1. Intramural, submucous, and subserous leiomyoma of uterus  D25.1     D25.0     D25.2       2. Bulky or enlarged uterus  N85.2          Follow-up with her scheduled surgery at [facility].    Total video visit time greater than 15 minutes    Delmar Landau, MD

## 2022-06-30 ENCOUNTER — Ambulatory Visit (INDEPENDENT_AMBULATORY_CARE_PROVIDER_SITE_OTHER): Payer: 59 | Admitting: Primary Care

## 2022-07-01 ENCOUNTER — Encounter (INDEPENDENT_AMBULATORY_CARE_PROVIDER_SITE_OTHER): Payer: Self-pay | Admitting: OBSTETRICS/GYNECOLOGY

## 2022-07-30 ENCOUNTER — Inpatient Hospital Stay (HOSPITAL_COMMUNITY)
Admission: RE | Admit: 2022-07-30 | Discharge: 2022-07-30 | Disposition: A | Discharge status: Home / Self Care | Payer: 59 | Source: Ambulatory Visit

## 2022-07-30 ENCOUNTER — Encounter (HOSPITAL_COMMUNITY): Payer: Self-pay

## 2022-07-30 HISTORY — DX: Presence of spectacles and contact lenses: Z97.3

## 2022-07-30 HISTORY — DX: Gastro-esophageal reflux disease without esophagitis: K21.9

## 2022-07-30 HISTORY — DX: Anxiety disorder, unspecified: F41.9

## 2022-07-30 HISTORY — DX: Type 2 diabetes mellitus without complications: E11.9

## 2022-07-30 HISTORY — DX: Unspecified astigmatism, unspecified eye: H52.209

## 2022-07-30 HISTORY — DX: Nausea with vomiting, unspecified: Z98.890

## 2022-07-30 HISTORY — DX: Depression, unspecified: F32.A

## 2022-07-30 HISTORY — DX: Personal history of other specified conditions: Z87.898

## 2022-08-30 ENCOUNTER — Other Ambulatory Visit: Payer: Self-pay

## 2022-08-30 ENCOUNTER — Ambulatory Visit (HOSPITAL_COMMUNITY): Payer: 59 | Admitting: Certified Registered"

## 2022-08-30 ENCOUNTER — Observation Stay
Admission: RE | Admit: 2022-08-30 | Discharge: 2022-08-31 | Disposition: A | Discharge status: Home / Self Care | Payer: 59 | Source: Ambulatory Visit | Attending: OBSTETRICS/GYNECOLOGY | Admitting: OBSTETRICS/GYNECOLOGY

## 2022-08-30 ENCOUNTER — Encounter (HOSPITAL_COMMUNITY): Payer: Self-pay | Admitting: OBSTETRICS/GYNECOLOGY

## 2022-08-30 ENCOUNTER — Encounter (HOSPITAL_COMMUNITY)
Admission: RE | Disposition: A | Discharge status: Home / Self Care | Payer: Self-pay | Source: Ambulatory Visit | Attending: OBSTETRICS/GYNECOLOGY

## 2022-08-30 DIAGNOSIS — K219 Gastro-esophageal reflux disease without esophagitis: Secondary | ICD-10-CM | POA: Insufficient documentation

## 2022-08-30 DIAGNOSIS — K660 Peritoneal adhesions (postprocedural) (postinfection): Secondary | ICD-10-CM | POA: Insufficient documentation

## 2022-08-30 DIAGNOSIS — M797 Fibromyalgia: Secondary | ICD-10-CM | POA: Insufficient documentation

## 2022-08-30 DIAGNOSIS — N838 Other noninflammatory disorders of ovary, fallopian tube and broad ligament: Secondary | ICD-10-CM | POA: Insufficient documentation

## 2022-08-30 DIAGNOSIS — Z9889 Other specified postprocedural states: Principal | ICD-10-CM | POA: Diagnosis present

## 2022-08-30 DIAGNOSIS — N83202 Unspecified ovarian cyst, left side: Secondary | ICD-10-CM

## 2022-08-30 DIAGNOSIS — F32A Depression, unspecified: Secondary | ICD-10-CM | POA: Insufficient documentation

## 2022-08-30 DIAGNOSIS — E1165 Type 2 diabetes mellitus with hyperglycemia: Secondary | ICD-10-CM | POA: Insufficient documentation

## 2022-08-30 DIAGNOSIS — Z79899 Other long term (current) drug therapy: Secondary | ICD-10-CM | POA: Insufficient documentation

## 2022-08-30 DIAGNOSIS — Z794 Long term (current) use of insulin: Secondary | ICD-10-CM | POA: Insufficient documentation

## 2022-08-30 DIAGNOSIS — Z87891 Personal history of nicotine dependence: Secondary | ICD-10-CM | POA: Insufficient documentation

## 2022-08-30 DIAGNOSIS — Z7984 Long term (current) use of oral hypoglycemic drugs: Secondary | ICD-10-CM | POA: Insufficient documentation

## 2022-08-30 DIAGNOSIS — D259 Leiomyoma of uterus, unspecified: Secondary | ICD-10-CM

## 2022-08-30 DIAGNOSIS — N854 Malposition of uterus: Secondary | ICD-10-CM

## 2022-08-30 DIAGNOSIS — F419 Anxiety disorder, unspecified: Secondary | ICD-10-CM | POA: Insufficient documentation

## 2022-08-30 DIAGNOSIS — D72829 Elevated white blood cell count, unspecified: Secondary | ICD-10-CM | POA: Insufficient documentation

## 2022-08-30 DIAGNOSIS — E785 Hyperlipidemia, unspecified: Secondary | ICD-10-CM | POA: Insufficient documentation

## 2022-08-30 LAB — CBC
HCT: 43.5 % (ref 34.8–46.0)
HCT: 37.8 % (ref 34.8–46.0)
HGB: 14.4 g/dL (ref 11.5–16.0)
HGB: 12.1 g/dL (ref 11.5–16.0)
MCH: 30.2 pg (ref 26.0–32.0)
MCH: 30.1 pg (ref 26.0–32.0)
MCHC: 33.1 g/dL (ref 31.0–35.5)
MCHC: 32 g/dL (ref 31.0–35.5)
MCV: 91.2 fL (ref 78.0–100.0)
MCV: 94 fL (ref 78.0–100.0)
MPV: 9.8 fL (ref 8.7–12.5)
MPV: 9.9 fL (ref 8.7–12.5)
PLATELETS: 289 10*3/uL (ref 150–400)
PLATELETS: 267 10*3/uL (ref 150–400)
RBC: 4.77 10*6/uL (ref 3.85–5.22)
RBC: 4.02 10*6/uL (ref 3.85–5.22)
RDW-CV: 12.8 % (ref 11.5–15.5)
RDW-CV: 13 % (ref 11.5–15.5)
WBC: 9.1 10*3/uL (ref 3.7–11.0)
WBC: 21.6 10*3/uL — ABNORMAL HIGH (ref 3.7–11.0)

## 2022-08-30 LAB — POC BLOOD GLUCOSE (RESULTS)
GLUCOSE, POC: 200 mg/dl — ABNORMAL HIGH (ref 65–125)
GLUCOSE, POC: 228 mg/dl — ABNORMAL HIGH (ref 65–125)
GLUCOSE, POC: 207 mg/dl — ABNORMAL HIGH (ref 65–125)
GLUCOSE, POC: 297 mg/dl — ABNORMAL HIGH (ref 65–125)

## 2022-08-30 LAB — TYPE AND SCREEN
ABO/RH(D): A POS
ANTIBODY SCREEN: NEGATIVE

## 2022-08-30 SURGERY — SALPINGECTOMY LAPAROSCOPIC
Anesthesia: General | Site: Abdomen | Wound class: Clean Contaminated Wounds-The respiratory, GI, Genital, or urinary

## 2022-08-30 MED ORDER — FENTANYL (PF) 50 MCG/ML INJECTION SOLUTION
INTRAMUSCULAR | Status: AC
Start: 2022-08-30 — End: 2022-08-30
  Filled 2022-08-30: qty 2

## 2022-08-30 MED ORDER — LACTATED RINGERS INTRAVENOUS SOLUTION
INTRAVENOUS | Status: DC
Start: 2022-08-30 — End: 2022-08-31

## 2022-08-30 MED ORDER — DOCUSATE SODIUM 100 MG CAPSULE
100.0000 mg | ORAL_CAPSULE | Freq: Two times a day (BID) | ORAL | Status: DC
Start: 2022-08-30 — End: 2022-08-31
  Administered 2022-08-30 – 2022-08-31 (×3): 100 mg via ORAL
  Filled 2022-08-30 (×4): qty 1

## 2022-08-30 MED ORDER — ROCURONIUM 10 MG/ML INTRAVENOUS SYRINGE WRAPPER
INJECTION | INTRAVENOUS | Status: AC
Start: 2022-08-30 — End: 2022-08-30
  Filled 2022-08-30: qty 5

## 2022-08-30 MED ORDER — ACETAMINOPHEN 1,000 MG/100 ML (10 MG/ML) INTRAVENOUS SOLUTION
Freq: Once | INTRAVENOUS | Status: DC | PRN
Start: 2022-08-30 — End: 2022-08-30
  Administered 2022-08-30: 1000 mg via INTRAVENOUS

## 2022-08-30 MED ORDER — ONDANSETRON HCL (PF) 4 MG/2 ML INJECTION SOLUTION
4.0000 mg | Freq: Four times a day (QID) | INTRAMUSCULAR | Status: AC | PRN
Start: 2022-08-30 — End: ?

## 2022-08-30 MED ORDER — DEXTROSE 50 % IN WATER (D50W) INTRAVENOUS SYRINGE
12.5000 g | INJECTION | INTRAVENOUS | Status: DC | PRN
Start: 2022-08-30 — End: 2022-08-31
  Filled 2022-08-30: qty 50

## 2022-08-30 MED ORDER — HYDROMORPHONE (PF) 0.5 MG/0.5 ML INJECTION SYRINGE
INJECTION | INTRAMUSCULAR | Status: AC
Start: 2022-08-30 — End: 2022-08-30
  Filled 2022-08-30: qty 0.5

## 2022-08-30 MED ORDER — DEXAMETHASONE SODIUM PHOSPHATE 4 MG/ML INJECTION SOLUTION
Freq: Once | INTRAMUSCULAR | Status: DC | PRN
Start: 2022-08-30 — End: 2022-08-30
  Administered 2022-08-30: 4 mg via INTRAVENOUS

## 2022-08-30 MED ORDER — FENTANYL (PF) 50 MCG/ML INJECTION SOLUTION
12.5000 ug | INTRAMUSCULAR | Status: DC | PRN
Start: 2022-08-30 — End: 2022-08-30

## 2022-08-30 MED ORDER — DEXTROSE 5% IN WATER (D5W) FLUSH BAG - 250 ML
INTRAVENOUS | Status: DC | PRN
Start: 2022-08-30 — End: 2022-08-31

## 2022-08-30 MED ORDER — OXYCODONE 5 MG TABLET
5.0000 mg | ORAL_TABLET | ORAL | Status: DC | PRN
Start: 2022-08-30 — End: 2022-08-31
  Administered 2022-08-30 – 2022-08-31 (×4): 5 mg via ORAL
  Filled 2022-08-30 (×4): qty 1

## 2022-08-30 MED ORDER — INSULIN LISPRO 100 UNIT/ML SUB-Q SSIP VIAL
0.0000 [IU] | INJECTION | Freq: Four times a day (QID) | SUBCUTANEOUS | Status: DC
Start: 2022-08-30 — End: 2022-08-31
  Administered 2022-08-30: 3 [IU] via SUBCUTANEOUS
  Administered 2022-08-30: 2 [IU] via SUBCUTANEOUS
  Administered 2022-08-31: 1 [IU] via SUBCUTANEOUS
  Administered 2022-08-31: 0 [IU] via SUBCUTANEOUS
  Filled 2022-08-30: qty 20
  Filled 2022-08-30: qty 10
  Filled 2022-08-30: qty 30

## 2022-08-30 MED ORDER — DAPAGLIFLOZIN PROPANEDIOL 5 MG TABLET
10.0000 mg | ORAL_TABLET | Freq: Every morning | ORAL | Status: DC
Start: 2022-08-31 — End: 2022-08-31
  Administered 2022-08-31: 10 mg via ORAL
  Filled 2022-08-30 (×2): qty 2

## 2022-08-30 MED ORDER — SODIUM CHLORIDE 0.9% FLUSH BAG - 250 ML
INTRAVENOUS | Status: AC | PRN
Start: 2022-08-30 — End: ?

## 2022-08-30 MED ORDER — SUGAMMADEX 100 MG/ML INTRAVENOUS SOLUTION
Freq: Once | INTRAVENOUS | Status: DC | PRN
Start: 2022-08-30 — End: 2022-08-30
  Administered 2022-08-30: 200 mg via INTRAVENOUS

## 2022-08-30 MED ORDER — LACTATED RINGERS INTRAVENOUS SOLUTION
INTRAVENOUS | Status: DC
Start: 2022-08-30 — End: 2022-08-30
  Administered 2022-08-30: 0 mL via INTRAVENOUS

## 2022-08-30 MED ORDER — SCOPOLAMINE 1 MG OVER 3 DAYS TRANSDERMAL PATCH
1.0000 | MEDICATED_PATCH | Freq: Once | TRANSDERMAL | Status: DC
Start: 2022-08-30 — End: 2022-08-31
  Administered 2022-08-30: 1 via TRANSDERMAL
  Filled 2022-08-30: qty 1

## 2022-08-30 MED ORDER — ONDANSETRON HCL (PF) 4 MG/2 ML INJECTION SOLUTION
Freq: Once | INTRAMUSCULAR | Status: DC | PRN
Start: 2022-08-30 — End: 2022-08-30
  Administered 2022-08-30: 4 mg via INTRAVENOUS

## 2022-08-30 MED ORDER — DEXTROSE 5% IN WATER (D5W) FLUSH BAG - 250 ML
INTRAVENOUS | Status: AC | PRN
Start: 2022-08-30 — End: ?

## 2022-08-30 MED ORDER — MIDAZOLAM 1 MG/ML INJECTION SOLUTION
INTRAMUSCULAR | Status: AC
Start: 2022-08-30 — End: 2022-08-30
  Filled 2022-08-30: qty 2

## 2022-08-30 MED ORDER — KETOROLAC 30 MG/ML (1 ML) INJECTION SOLUTION
Freq: Once | INTRAMUSCULAR | Status: DC | PRN
Start: 2022-08-30 — End: 2022-08-30
  Administered 2022-08-30: 30 mg via INTRAVENOUS

## 2022-08-30 MED ORDER — PROPOFOL 10 MG/ML IV BOLUS
INJECTION | Freq: Once | INTRAVENOUS | Status: DC | PRN
Start: 2022-08-30 — End: 2022-08-30
  Administered 2022-08-30: 200 mg via INTRAVENOUS
  Administered 2022-08-30: 70 mg via INTRAVENOUS

## 2022-08-30 MED ORDER — PHENYLEPHRINE 1 MG/10 ML (100 MCG/ML) IN 0.9 % SOD.CHLORIDE IV SYRINGE
INJECTION | Freq: Once | INTRAVENOUS | Status: DC | PRN
Start: 2022-08-30 — End: 2022-08-30
  Administered 2022-08-30: 100 ug via INTRAVENOUS

## 2022-08-30 MED ORDER — PROPOFOL 10 MG/ML INTRAVENOUS EMULSION
INTRAVENOUS | Status: AC
Start: 2022-08-30 — End: 2022-08-30
  Filled 2022-08-30: qty 50

## 2022-08-30 MED ORDER — VENLAFAXINE ER 37.5 MG CAPSULE,EXTENDED RELEASE 24 HR
37.5000 mg | ORAL_CAPSULE | Freq: Every day | ORAL | Status: DC
Start: 2022-08-31 — End: 2022-08-31
  Administered 2022-08-31: 37.5 mg via ORAL
  Filled 2022-08-30 (×2): qty 1

## 2022-08-30 MED ORDER — PROPOFOL 10 MG/ML INTRAVENOUS EMULSION
INTRAVENOUS | Status: AC
Start: 2022-08-30 — End: 2022-08-30
  Filled 2022-08-30: qty 20

## 2022-08-30 MED ORDER — SODIUM CHLORIDE 0.9 % (FLUSH) INJECTION SYRINGE
2.0000 mL | INJECTION | INTRAMUSCULAR | Status: DC | PRN
Start: 2022-08-30 — End: 2022-08-31

## 2022-08-30 MED ORDER — FENTANYL (PF) 50 MCG/ML INJECTION SOLUTION
Freq: Once | INTRAMUSCULAR | Status: DC | PRN
Start: 2022-08-30 — End: 2022-08-30
  Administered 2022-08-30 (×2): 50 ug via INTRAVENOUS

## 2022-08-30 MED ORDER — SODIUM CHLORIDE 0.9 % (FLUSH) INJECTION SYRINGE
2.0000 mL | INJECTION | Freq: Three times a day (TID) | INTRAMUSCULAR | Status: AC
Start: 2022-08-30 — End: ?
  Administered 2022-08-30: 3 mL
  Administered 2022-08-30 – 2022-08-31 (×2): 0 mL

## 2022-08-30 MED ORDER — OXYCODONE-ACETAMINOPHEN 5 MG-325 MG TABLET
1.0000 | ORAL_TABLET | Freq: Once | ORAL | Status: DC | PRN
Start: 2022-08-30 — End: 2022-08-30
  Administered 2022-08-30: 1 via ORAL
  Filled 2022-08-30: qty 1

## 2022-08-30 MED ORDER — DEXTROSE 5 % IN WATER (D5W) INTRAVENOUS SOLUTION
2.0000 g | Freq: Once | INTRAVENOUS | Status: AC
Start: 2022-08-30 — End: 2022-08-30
  Administered 2022-08-30: 2 g via INTRAVENOUS
  Filled 2022-08-30: qty 10

## 2022-08-30 MED ORDER — FENTANYL (PF) 50 MCG/ML INJECTION SOLUTION
25.0000 ug | INTRAMUSCULAR | Status: AC | PRN
Start: 2022-08-30 — End: 2022-08-30
  Administered 2022-08-30 (×4): 25 ug via INTRAVENOUS
  Filled 2022-08-30 (×2): qty 2

## 2022-08-30 MED ORDER — ONDANSETRON HCL (PF) 4 MG/2 ML INJECTION SOLUTION
INTRAMUSCULAR | Status: AC
Start: 2022-08-30 — End: 2022-08-30
  Filled 2022-08-30: qty 2

## 2022-08-30 MED ORDER — SODIUM CHLORIDE 0.9% FLUSH BAG - 250 ML
INTRAVENOUS | Status: DC | PRN
Start: 2022-08-30 — End: 2022-08-31

## 2022-08-30 MED ORDER — LIDOCAINE 1 %-EPINEPHRINE 1:100,000 INJECTION SOLUTION
15.0000 mL | Freq: Once | INTRAMUSCULAR | Status: DC | PRN
Start: 2022-08-30 — End: 2022-08-30

## 2022-08-30 MED ORDER — GLUCAGON HCL 1 MG/ML SOLUTION FOR INJECTION
1.0000 mg | Freq: Once | INTRAMUSCULAR | Status: AC | PRN
Start: 2022-08-30 — End: ?

## 2022-08-30 MED ORDER — LACTATED RINGERS INTRAVENOUS SOLUTION
INTRAVENOUS | Status: DC | PRN
Start: 2022-08-30 — End: 2022-08-30
  Administered 2022-08-30: 0 via INTRAVENOUS

## 2022-08-30 MED ORDER — SODIUM CHLORIDE 0.9 % (FLUSH) INJECTION SYRINGE
2.0000 mL | INJECTION | Freq: Three times a day (TID) | INTRAMUSCULAR | Status: DC
Start: 2022-08-30 — End: 2022-08-31
  Administered 2022-08-30: 3 mL
  Administered 2022-08-30 – 2022-08-31 (×2): 0 mL

## 2022-08-30 MED ORDER — SODIUM CHLORIDE 0.9 % (FLUSH) INJECTION SYRINGE
2.0000 mL | INJECTION | Freq: Three times a day (TID) | INTRAMUSCULAR | Status: AC
Start: 2022-08-30 — End: ?
  Administered 2022-08-30: 0 mL
  Administered 2022-08-30: 3 mL
  Administered 2022-08-31: 0 mL

## 2022-08-30 MED ORDER — GLIPIZIDE 5 MG TABLET
5.0000 mg | ORAL_TABLET | Freq: Two times a day (BID) | ORAL | Status: DC
Start: 2022-08-30 — End: 2022-08-31
  Administered 2022-08-30 – 2022-08-31 (×2): 5 mg via ORAL
  Filled 2022-08-30 (×4): qty 1

## 2022-08-30 MED ORDER — PROPOFOL 10 MG/ML INTRAVENOUS EMULSION
INTRAVENOUS | Status: DC | PRN
Start: 2022-08-30 — End: 2022-08-30
  Administered 2022-08-30: 0 ug/kg/min via INTRAVENOUS
  Administered 2022-08-30: 80 ug/kg/min via INTRAVENOUS
  Administered 2022-08-30: 100 ug/kg/min via INTRAVENOUS

## 2022-08-30 MED ORDER — INSULIN GLARGINE-YFGN (U-100) 100 UNIT/ML SUBCUTANEOUS SOLUTION
38.0000 [IU] | Freq: Every day | SUBCUTANEOUS | Status: DC
Start: 2022-08-31 — End: 2022-08-31
  Administered 2022-08-31: 38 [IU] via SUBCUTANEOUS
  Filled 2022-08-30: qty 38

## 2022-08-30 MED ORDER — ONDANSETRON HCL (PF) 4 MG/2 ML INJECTION SOLUTION
4.0000 mg | Freq: Once | INTRAMUSCULAR | Status: DC | PRN
Start: 2022-08-30 — End: 2022-08-30
  Administered 2022-08-30: 4 mg via INTRAVENOUS
  Filled 2022-08-30: qty 2

## 2022-08-30 MED ORDER — MIDAZOLAM (PF) 1 MG/ML INJECTION SOLUTION
Freq: Once | INTRAMUSCULAR | Status: DC | PRN
Start: 2022-08-30 — End: 2022-08-30
  Administered 2022-08-30: 2 mg via INTRAVENOUS

## 2022-08-30 MED ORDER — HYDROMORPHONE (PF) 0.5 MG/0.5 ML INJECTION SYRINGE
INJECTION | Freq: Once | INTRAMUSCULAR | Status: DC | PRN
Start: 2022-08-30 — End: 2022-08-30
  Administered 2022-08-30: .3 mg via INTRAVENOUS
  Administered 2022-08-30: .5 mg via INTRAVENOUS
  Administered 2022-08-30: .2 mg via INTRAVENOUS

## 2022-08-30 MED ORDER — DEXAMETHASONE SODIUM PHOSPHATE 4 MG/ML INJECTION SOLUTION
INTRAMUSCULAR | Status: AC
Start: 2022-08-30 — End: 2022-08-30
  Filled 2022-08-30: qty 1

## 2022-08-30 MED ORDER — DEXTROSE 40 % ORAL GEL
15.0000 g | ORAL | Status: AC | PRN
Start: 2022-08-30 — End: ?

## 2022-08-30 MED ORDER — EPHEDRINE SULFATE 5 MG/ML INTRAVENOUS SOLUTION
Freq: Once | INTRAVENOUS | Status: DC | PRN
Start: 2022-08-30 — End: 2022-08-30
  Administered 2022-08-30: 10 mg via INTRAVENOUS
  Administered 2022-08-30: 5 mg via INTRAVENOUS

## 2022-08-30 MED ORDER — LIDOCAINE (PF) 20 MG/ML (2 %) INJECTION SOLUTION
INTRAMUSCULAR | Status: AC
Start: 2022-08-30 — End: 2022-08-30
  Filled 2022-08-30: qty 5

## 2022-08-30 MED ORDER — LIDOCAINE (PF) 100 MG/5 ML (2 %) INTRAVENOUS SYRINGE
INJECTION | Freq: Once | INTRAVENOUS | Status: DC | PRN
Start: 2022-08-30 — End: 2022-08-30
  Administered 2022-08-30: 100 mg via INTRAVENOUS

## 2022-08-30 MED ORDER — IBUPROFEN 600 MG TABLET
600.0000 mg | ORAL_TABLET | Freq: Four times a day (QID) | ORAL | Status: AC
Start: 2022-08-30 — End: ?
  Administered 2022-08-30 – 2022-08-31 (×4): 600 mg via ORAL
  Filled 2022-08-30 (×5): qty 1

## 2022-08-30 MED ORDER — ACETAMINOPHEN 325 MG TABLET
975.0000 mg | ORAL_TABLET | Freq: Three times a day (TID) | ORAL | Status: AC
Start: 2022-08-30 — End: ?
  Administered 2022-08-30 (×2): 975 mg via ORAL
  Administered 2022-08-31: 0 mg via ORAL
  Filled 2022-08-30 (×4): qty 3

## 2022-08-30 MED ORDER — DROPERIDOL 2.5 MG/ML INJECTION SOLUTION
0.6250 mg | Freq: Once | INTRAMUSCULAR | Status: DC | PRN
Start: 2022-08-30 — End: 2022-08-30

## 2022-08-30 MED ORDER — SUGAMMADEX 100 MG/ML INTRAVENOUS SOLUTION
INTRAVENOUS | Status: AC
Start: 2022-08-30 — End: 2022-08-30
  Filled 2022-08-30: qty 5

## 2022-08-30 MED ORDER — PHENYLEPHRINE 1 MG/10 ML (100 MCG/ML) IN 0.9 % SOD.CHLORIDE IV SYRINGE
INJECTION | INTRAVENOUS | Status: AC
Start: 2022-08-30 — End: 2022-08-30
  Filled 2022-08-30: qty 10

## 2022-08-30 MED ORDER — DEXMEDETOMIDINE 4 MCG/ML IV DILUTION
INTRAMUSCULAR | Status: AC
Start: 2022-08-30 — End: 2022-08-30
  Filled 2022-08-30: qty 10

## 2022-08-30 MED ORDER — DEXMEDETOMIDINE 4 MCG/ML IV DILUTION
Freq: Once | INTRAMUSCULAR | Status: DC | PRN
Start: 2022-08-30 — End: 2022-08-30
  Administered 2022-08-30: 10 ug via INTRAVENOUS
  Administered 2022-08-30: 8 ug via INTRAVENOUS

## 2022-08-30 MED ORDER — SUGAMMADEX 100 MG/ML INTRAVENOUS SOLUTION
INTRAVENOUS | Status: AC
Start: 2022-08-30 — End: 2022-08-30
  Filled 2022-08-30: qty 2

## 2022-08-30 MED ORDER — SIMETHICONE 80 MG CHEWABLE TABLET
80.0000 mg | CHEWABLE_TABLET | Freq: Four times a day (QID) | ORAL | Status: DC
Start: 2022-08-30 — End: 2022-08-31
  Administered 2022-08-30 – 2022-08-31 (×4): 80 mg via ORAL
  Filled 2022-08-30 (×4): qty 1

## 2022-08-30 MED ORDER — ATORVASTATIN 10 MG TABLET
5.0000 mg | ORAL_TABLET | Freq: Every day | ORAL | Status: DC
Start: 2022-08-30 — End: 2022-08-31

## 2022-08-30 MED ORDER — ROCURONIUM 10 MG/ML INTRAVENOUS SYRINGE WRAPPER
INJECTION | Freq: Once | INTRAVENOUS | Status: DC | PRN
Start: 2022-08-30 — End: 2022-08-30
  Administered 2022-08-30: 30 mg via INTRAVENOUS
  Administered 2022-08-30 (×2): 50 mg via INTRAVENOUS
  Administered 2022-08-30: 20 mg via INTRAVENOUS

## 2022-08-30 SURGICAL SUPPLY — 41 items
ADH SKNCLS CYNCRLT EXOFIN HVSC STRL TISS LF  DISP 1G (MED SURG SUPPLIES) ×5 IMPLANT
APPL 70% ISPRP 2% CHG 26ML CHLRPRP HI-LT ORNG PREP STRL LF  DISP CLR (MED SURG SUPPLIES) ×5 IMPLANT
APPL ENDOS SRGCL 73.6OZ (ENDOSCOPIC SUPPLIES) ×5 IMPLANT
CATH URETH DOVER 16FR FOLEY 2W LRG SMOOTH DRAIN EYE FIRM TIP SIL 10ML STRL LF  BLU STRP CLR (UROLOGICAL SUPPLIES) ×5 IMPLANT
CONV USE 342585 - TRAY LAPAROSCOPIC ~~LOC~~ - RUBY MEMORIAL HOSPITAL (CUSTOM TRAYS & PACK) ×5 IMPLANT
CONV USE ITEM 124344 - LEGGINGS SURG 48X31IN CUF PRXM SMS 6IN STRL LF  DISP (DRAPE/PACKS/SHEETS/OR TOWEL) ×5
CONV USE ITEM 136676 - DRAPE PCH DRAIN PORT 33.5X32X14IN UNDR BUTT PRXM LF  STRL DISP SURG SMS 46X33.5IN (DRAPE/PACKS/SHEETS/OR TOWEL) ×5
DCNTR FLUID 3IN TRANSF STRL LF (IV TUBING & ACCESSORIES) IMPLANT
DEVICE CLSR NEOCLOSE PORT ANCH GUIDE KIT 5/12 8/15 REG STRL LF  DISP (VASCULAR) IMPLANT
DEVICE ENDOS ENDO PNUT 5MM 45CM SWAB COTTON DISP (ENDOSCOPIC SUPPLIES) ×5
DEVICE SUT ENDO STCH 10MM 15IN PSHBTN GRIP HNDL ENDOS SFT TISS STRL LF  DISP (ENDOSCOPIC SUPPLIES) ×5 IMPLANT
DRAPE PCH DRAIN PORT 33.5X32X14IN UNDR BUTT PRXM LF  STRL DISP SURG SMS 46X33.5IN (DRAPE/PACKS/SHEETS/OR TOWEL) ×5 IMPLANT
DSCT LAPSCP 2 JAW CORD BIPOLAR COAGULATE HANDPC 33CM 5MM PKS OMNI STRL DISP (ENDOSCOPIC SUPPLIES) ×5 IMPLANT
ELECTRODE ESURG 33CM 5MM PLAS J-HOOK STRL DISP RPD HMST CUT COAGULATE DSCT LAPSCP (SURGICAL CUTTING SUPPLIES) ×5 IMPLANT
HEMOSTAT ABS POWDER SRGCL OXD REGENERATED CLU (WOUND CARE SUPPLY) ×5 IMPLANT
IMG CLEARIFY 8X6IN WARM HUB TROCAR WIPE MRFBR SYSTEM DISP (ENDOSCOPIC SUPPLIES) ×5 IMPLANT
IRR SUCT STRKFL2 TIP STRL LF  DISP (ENDOSCOPIC SUPPLIES) ×5 IMPLANT
KIT RM TURNOVER STPC CUSTOM ALLIED XL GEN SURG (CUSTOM TRAYS & PACK) ×5 IMPLANT
LABEL MED EZ PEEL MRKR LF (MED SURG SUPPLIES) ×5 IMPLANT
LEGGINGS SURG 48X31IN CUF PRXM SMS 6IN STRL LF  DISP (DRAPE/PACKS/SHEETS/OR TOWEL) ×5 IMPLANT
LUB INSTR BTL PAD ELC LUBE FOAM STRL (MISCELLANEOUS PT CARE ITEMS) ×5 IMPLANT
MANIPULATR SURG ADVINCULA DELINEATOR UTER PLASTIC 3.5CM DISP ESURG DEV (MED SURG SUPPLIES) IMPLANT
MANIPULATR SURG ADVINCULA DELINEATOR UTER PLASTIC 3CM DISP ESURG DEV (MED SURG SUPPLIES) IMPLANT
MANIPULATR SURG ADVINCULA DELINEATOR UTER PLASTIC 4CM DISP ESURG DEV (MED SURG SUPPLIES) IMPLANT
MAT FLR 40X24IN ABS WTPRF BACKSHEET (MED SURG SUPPLIES) ×10 IMPLANT
MBO USE ITEM 130230 - DEVICE ENDOS ENDO PNUT 5MM 45CM SWAB COTTON DISP (ENDOSCOPIC SUPPLIES) ×5 IMPLANT
METER URINE EZ-LOK DRAIN BAG 350ML 2.5L STRL LF  DISP (UROLOGICAL SUPPLIES) ×5 IMPLANT
NEEDLE INSFL 120MM 14GA STD SRGNDL PNMPRTN SPRG LD BLUNT STY STRL LF  DISP SS PLASTIC RD (ENDOSCOPIC SUPPLIES) IMPLANT
POSITION SURGYPAD 36IN BUIL IN ARM PRTC ANTISKID STRP ADJ BODY STRAP VELCRO SYSTEM LF (MED SURG SUPPLIES) ×5 IMPLANT
POUCH 18X10IN LONG 2 ADH STRP 3 CMPRT STRDRP INSTR PLASTIC STRL (DRAPE/PACKS/SHEETS/OR TOWEL) ×5 IMPLANT
RELOAD SUT AST 2-0 ES-9 48IN 1 STCH ENDO STCH POLYSRB BRLN LACTOMER NYL STRL DISP VIOL (ENDOSCOPIC SUPPLIES) ×10 IMPLANT
SEALDIVD LAPSCP 37CM LIGASURE 350D 18.5MM MARYLAND CURVE JAW NANO COAT 20.3MM VLAB FT10 LS10 (ENDOSCOPIC SUPPLIES) ×5 IMPLANT
SET .241IN 77IN NONVENT PIERCE PIN LRG BORE TUBE SIGHT CHAMBER GRVTY FLOW IRRG CSCP BLADDER STRL LF (MED SURG SUPPLIES) IMPLANT
STRIP 3X.25IN STRSTRP PLSTR REINF SKNCLS WHT STRL LF (WOUND CARE SUPPLY) IMPLANT
SYRINGE LL 10ML LF  STRL GRAD N-PYRG DEHP-FR PVC FREE MED DISP (MED SURG SUPPLIES) ×5 IMPLANT
TRAY LAPAROSCOPIC ~~LOC~~ - RUBY MEMORIAL HOSPITAL (CUSTOM TRAYS & PACK) ×5
TRAY SKIN SCRUB 8IN VNYL COTTON 6 WNG 6 SPONGE STICK 2 TIP APPL DRY STRL LF (MED SURG SUPPLIES) ×5 IMPLANT
TROCAR LAPSCP STD 100MM 11MM VERSAONE FIX CANN BLADE STRL LF  DISP (ENDOSCOPIC SUPPLIES) IMPLANT
TROCAR LAPSCP STD 100MM 5MM VERSAONE FIX CANN BLADE STRL LF  DISP (ENDOSCOPIC SUPPLIES) ×5 IMPLANT
TROCAR LAPSCP STD 100MM 5MM VERSAONE FIX CANN BLDLS DLPHN NOSE TIP OPTC STRL LF  DISP (ENDOSCOPIC SUPPLIES) ×5 IMPLANT
WATER STRL 2000ML PLASTIC CONTAINR UROMATIC LF (MED SURG SUPPLIES) IMPLANT

## 2022-08-30 NOTE — Progress Notes (Signed)
[place] Department of Obstetric & Gynecology        DATE OF SERVICE: 08/30/2022, 13:09   PATIENT: Lynn Gross  CHART NUMBER: [id number]      Pt tolerated procedure well.   Transferred to PACU in stable condition.   Pt is s/p operative laparoscopy with TAH.  Patient to be admitted to gyn service overnight.   May be discharged to the floor once criteria is met.   Foley catheter to remain in place until 1800.        Karleen Dolphin, MD 08/30/2022 13:09  [facility]  Department of Obstetrics & Gynecology       I saw and examined the patient.  I reviewed the resident's note.  I agree with the findings and plan of care as documented in the resident's note.  Any exceptions/additions are edited/noted.    Delmar Landau, MD

## 2022-08-30 NOTE — H&P (Signed)
[place] Department of Obstetrics & Gynecology      Preoperative History and Physical      DATE OF SERVICE: 08/30/2022, 07:50   PATIENT: Lynn Gross  DOB: 10-Oct-1975   CHART NUMBER: [id number]    History of Present Illness: This is a 47 y.o. female who presents for scheduled total laparoscopic hysterectomy bilateral salpingectomy with possible total abdominal hysterectomy and possible left oopherectomyfor enlarged uterus due to multiple leiomyoma measuring 11.99 x 7.08 x 6.09 cm causing symptoms of urinary outflow obstruction. Also noted was an ovarian cystic lesion on the left possible exophytic cyst versus paratubal cyst. She reports she is doing well today and denies any complaints. Specifically denies CP, SOB, abdominal pain, N/V.     LMP: No LMP recorded.     Patient Active Problem List   Diagnosis    GAD (generalized anxiety disorder)       Past Medical History:   Diagnosis Date    Anxiety     Astigmatism     Depression     Diabetes mellitus (CMS HCC)     Esophageal reflux     Fibromyalgia     History of anesthesia complications     woke up early, shaking    Irritable bowel syndrome     PCOS (polycystic ovarian syndrome)     PONV (postoperative nausea and vomiting)     Type 2 diabetes mellitus (CMS HCC)     Wears glasses            Past Surgical History:   Procedure Laterality Date    HX ACL RECONSTRUCTION Right     HX CESAREAN SECTION      x2    HX MENISCECTOMY Right     HX TUBAL LIGATION      KNEE CARTILAGE SURGERY Right            Social History     Tobacco Use    Smoking status: Former     Current packs/day: 0.00     Types: Cigarettes     Quit date: 2009     Years since quitting: 15.6    Smokeless tobacco: Never   Vaping Use    Vaping status: Never Used   Substance Use Topics    Alcohol use: Never    Drug use: Never       Allergies   Allergen Reactions    Adhesive Rash     OK with paper and silk tape    Metformin Diarrhea    Bactrim [Sulfamethoxazole-Trimethoprim]     Duloxetine  Other Adverse  Reaction (Add comment)    Trulicity [Dulaglutide]     Empagliflozin  Other Adverse Reaction (Add comment)    Iodinated Contrast Media Itching        Medications Prior to Admission       Prescriptions    atorvastatin (LIPITOR) 10 mg Oral Tablet    Take 0.5 Tablets (5 mg total) by mouth Once a day    calcium carbonate/vitamin D3 (VITAMIN D-3 ORAL)    Take by mouth Once a day    cyanocobalamin (VITAMIN B 12) 1,000 mcg Oral Tablet    1 Tablet (1,000 mcg total)    dapagliflozin propanediol (FARXIGA) 10 mg Oral Tablet    Take 1 Tablet (10 mg total) by mouth Every morning    dicyclomine (BENTYL) 10 mg Oral Capsule    Take 1 Capsule (10 mg total) by mouth Once per day as needed    glipiZIDE (GLUCOTROL)  5 mg Oral Tablet    Take 1 Tablet (5 mg total) by mouth Twice a day before meals    Ibuprofen (MOTRIN) 600 mg Oral Tablet    Take 1 Tablet (600 mg total) by mouth Four times a day as needed for Pain    Patient not taking:  Reported on 07/30/2022    insulin glargine-yfgn 100 Units/mL Subcutaneous Insulin Pen    Inject 38 Units under the skin Every morning    ondansetron (ZOFRAN) 4 mg Oral Tablet    Take 1 Tablet (4 mg total) by mouth Once per day as needed    venlafaxine (EFFEXOR XR) 37.5 mg Oral Capsule, Sust. Release 24 hr    Take 1 Capsule (37.5 mg total) by mouth Once a day            Allergies   Allergen Reactions    Adhesive Rash     OK with paper and silk tape    Metformin Diarrhea    Bactrim [Sulfamethoxazole-Trimethoprim]     Duloxetine  Other Adverse Reaction (Add comment)    Trulicity [Dulaglutide]     Empagliflozin  Other Adverse Reaction (Add comment)    Iodinated Contrast Media Itching       Social History     Tobacco Use    Smoking status: Former     Current packs/day: 0.00     Types: Cigarettes     Quit date: 2009     Years since quitting: 15.6    Smokeless tobacco: Never   Vaping Use    Vaping status: Never Used   Substance Use Topics    Alcohol use: Never    Drug use: Never       Physical Exam  Filed Vitals:     08/30/22 0719   BP: 136/73   Pulse: 97   Resp: 17   Temp: 36.7 C (98.1 F)   SpO2: 96%       General:  NAD, well-appearing  HEENT:  Atraumatic, normocephalic, MMM  Resp:  CTAB  CV:  RRR, no m/r/g  Abdo:  NTND  Ext:  No cyanosis or edema  Neuro:  CN II-XII grossly intact  Skin:  No apparent rashes or lesions    1. Preoperative State  - The patient's consent was previously reviewed by Dr. Titus Dubin.  All risks, benefits, and alternatives were discussed.  The patient wishes to proceed. She has been NPO since midnight.   - Continues to be appropriate surgical candidate  - Will proceed with procedure as planned    Isaias Sakai, MD FACOG 08/30/2022 07:50  [facility]  Department of Obstetrics & Gynecology

## 2022-08-30 NOTE — OR Surgeon (Signed)
[facility]                                                     OPERATIVE NOTE    Patient Name: Lynn Gross [facility] Number: [id number]   Date of Service: 08/30/2022   Date of Birth: 09-07-1975       Pre-Operative Diagnosis: 47 y.o. [id number] with a fibroid uterus causing urinary outflow obstruction and a left ovarian cystic lesion   Post-Operative Diagnosis: Same  Procedure(s)/Description:  Operative laparoscopy with bilateral salpingectomy, total abdominal hysterectomy     Findings:   1. Normal appearing external female genitalia  2. Normal appearing vagina and cervix, with anteverted uterus sounded to 12cm  3. Omental adhesions noted to anterior abdomina wall   4. Large fibroid uterus; multiple fibroids noted including large posterior body fibroid (see Media for image) and lower uterine segment fibroid abutting cervix   5. 3 cm left paratubal cyst  6. 0.5 cm right paratubal cyst    7. Bilateral ovaries and fallopian tubes otherwise normal in appearance     Attending Surgeon: Dr. Titus Dubin   Assistant(s): Karleen Dolphin, PGY-4; Rosalia Hammers, PGY-1     Anesthesia Type: General anesthesia  Estimated Blood Loss:  300 mL  Blood Given: None  Fluids Given: Crystalloid per anesthesia   Complications:  None  Wound Class: Clean Contaminated Wounds -Respiratory, GI, Genital, or Urinary    Tubes: None  Drains: None  Specimens/ Cultures: Uterus, cervix, bilateral fallopian tubes with paratubal cysts   Implants: None           Disposition: PACU - hemodynamically stable.  Condition: stable    Description of Procedure:   After informed consent was obtained, the patient was taken to the operating room where she was placed in the dorsal supine position. Venodynes were placed and pumping prior to induction of anesthesia. After general anesthesia was induced, the patient was prepped and draped in normal sterile fashion in the dorsal lithotomy position. A foley catheter was inserted and the bladder was drained for  clear sterile urine. A surgical pause was performed and the patient was identified by anesthesia, nursing, and surgical staff and the proposed procedure agreed upon. A bivalve speculum was placed in the vagina and a Cooper uterine manipulator was inserted into the uterus.   Attention was turned to the laparoscopic portion of procedure; The umbilicus was inverted and a 5mm incision was made with a 11 blade scalpel. A 5-mm optical trocar was inserted into the abdomen under direct visualization. The abdomen was insufflated with CO2 gas to a default pressure of . Further inspection confirmed intraabdominal placement. Survey of the abdomen was performed with the above-noted findings. A second port was placed in the left lower quadrant, with care to avoid the inferior epigastric vessels. 1% lidocaine was injected into the chosen site, then a 11mm incision was made with a scalpel and a 11 mm port was inserted under direct visualization. Pelvic survey was completed with a bowel grasper, noted as above. Omental adhesions were taken down during the Ligasure device. Then, a right lower quadrant port was placed next, once again with care to avoid the inferior epigastric vessels. 1% lidocaine was injected into the chosen incision site, a 5mm incision was made, then 5mm port placed under direct visualization atraumatically. Survey of  the pelvis was completed with above noted findings.   Decision was made to attempt laparoscopic dissection. The left fallopian tube and paratubal cyst were grasped using bowel graspers, and the Ligasure device was used to clamp, coagulate, and transect the fallopian tube along the mesosalpinx. Laparoscopic scissors were used to rupture the paratubal cyst, and the fallopian tube and cyst were removed from the 11mm port. The left round ligament was clamped, coagulated and transected using the Ligasure device. Special care was taken to avoid the ovary and infundibulopelvic ligament. This process  was repeated with the right round ligament and fallopian tube, once again taking care to avoid the right ovary and right infundibulopelvic ligament. The right fallopian tube was transected and was removed from the 11mm port. The broad ligament was separated into anterior and posterior leaflets with blunt traction and dissection. The anterior leaf of the broad ligament was incised along the bladder reflection using the Ligasure device. However, dense adhesions were noted. Due to anatomical distortion from several large fibroids, bleeding was encountered anteriorly.. Laparoscopic attempts for obtaining visualization were tried, however, due to patient's large posterior fibroid and distortion of pelvic anatomy, decision was made to convert to an open approach for patient safety.    A pfannenstiel incision was made and carried down to the fascia with a #10 blade scalpel.  The fascia was incised and extended with sharp dissection.  An alexis retractor was placed, taking care to avoid the bowel and omentum. The bilateral uterine cornua were grasped with Kocher clamps and elevated. The anterior leaf of the broad ligament was opened to the vesicouterine fold. The bladder was then dissected off the lower uterine segment. The bilateral uterine arteries were skeletonized. The bilateral uterine vessels were clamped, coagulated and transected using the Ligasure device. The posterior leaf of the broad ligament was incised to the bilateral uterosacral ligaments. The colpotomy was performed with the monopolar J hook. The uterus was removed and the vaginal cuff grasped and elevated with Kocher clamps. The vaginal cuff was closed with interrupted 0-Vicryl sutures in figure of eight fashion. The bilateral uterosacral ligaments were incorporated at the angles. The pelvis was copiously irrigated and was noted to be hemostatic. The bellies of the rectus muscle were examined and were found to be hemostatic. The fascia was then closed with  0 looped PDS.  The subcutaneous tissue was closed with 3-0 Vicryl.  The skin was closed with 4-0 Monocryl. A dressing with 2x2s and silk tape was placed over the incision.  The three incision sites were closed with 4-0 Monocryl and Exofin. The foley catheter was left in place at the end of the procedure. All counts were correct x2 at the end of the procedure. The patient tolerated the procedure well and was transported to the recovery room in stable condition.    Dr. Titus Dubin was present and participated in the entire operation.    Karleen Dolphin, MD 08/30/2022 16:01  PGY-4  [facility]  Department of Obstetrics & Gynecology      I was present for all key and/or critical portions of the case and immediately available at all times.      Delmar Landau, MD 08/30/2022, 17:04

## 2022-08-30 NOTE — Anesthesia Preprocedure Evaluation (Addendum)
ANESTHESIA PRE-OP EVALUATION  Planned Procedure: HYSTERECTOMY TOTAL LAPAROSCOPIC (Abdomen)  SALPINGECTOMY LAPAROSCOPIC (Bilateral)  OOPHORECTOMY LAPAROSCOPIC (Left)  HYSTERECTOMY TOTAL ABDOMINAL  Review of Systems    PONV (Pt  with h/o ponv and motion sickness.)       patient summary reviewed          Pulmonary     Cardiovascular    Cardiac cath 2016:  EF 60% with clean coronaries  ,       GI/Hepatic/Renal    GERD (Sleeps with head up) and well controlled        Endo/Other      type 2 diabetes/ controlled/ controlled with oral medications/ controlled with insulin/ controlled with diet    Neuro/Psych/MS    fibromyalgia, anxiety, depression     Cancer                  Physical Assessment      Airway       Mallampati: III    TM distance: >3 FB    Neck ROM: full  Mouth Opening: fair.            Dental       Dentition intact             Pulmonary    Breath sounds clear to auscultation       Cardiovascular    Rhythm: regular  Rate: Normal       Other findings  Small mouth opening.  Pt with TMJ issues.        Plan  ASA 3     Planned anesthesia type: general     general anesthesia with endotracheal tube intubation      PONV Plan:  I plan to administer pharmcologic prophalaxis antiemetics              Intravenous induction     Anesthesia issues/risks discussed are: Dental Injuries, Stroke, Aspiration, Difficult Airway, Blood Loss, Sore Throat, Cardiac Events/MI and PONV.  Anesthetic plan and risks discussed with patient  signed consent obtained      Use of blood products discussed with patient who consented to blood products.      Patient's NPO status is appropriate for Anesthesia.           Plan discussed with CRNA.          Urine Pregnancy Results: Negative

## 2022-08-30 NOTE — Anesthesia Transfer of Care (Signed)
ANESTHESIA TRANSFER OF CARE   Secilia Mishka Veerkamp is a 47 y.o. ,female, Weight: 105 kg (231 lb 4.2 oz)   had Procedure(s) with comments:  SALPINGECTOMY LAPAROSCOPIC  HYSTERECTOMY TOTAL ABDOMINAL - EXTENDED RECOVERY  LYSIS ADHESIONS LAPAROSCOPIC  performed  08/30/22   Primary Service: Delmar Landau, MD    Past Medical History:   Diagnosis Date    Anxiety     Astigmatism     Depression     Diabetes mellitus (CMS HCC)     Esophageal reflux     Fibromyalgia     History of anesthesia complications     woke up early, shaking    Irritable bowel syndrome     PCOS (polycystic ovarian syndrome)     PONV (postoperative nausea and vomiting)     Type 2 diabetes mellitus (CMS HCC)     Wears glasses       Allergy History as of 08/30/22       METFORMIN         Noted Status Severity Type Reaction    03/18/22 0902 Kem Kays 10/19/20 Active Medium  Diarrhea              IODINATED CONTRAST MEDIA         Noted Status Severity Type Reaction    03/18/22 0902 Kem Kays 03/11/22 Active Low  Itching              DULOXETINE         Noted Status Severity Type Reaction    03/18/22 0902 Kem Kays 10/02/21 Active    Other Adverse Reaction (Add comment)              EMPAGLIFLOZIN         Noted Status Severity Type Reaction    03/18/22 0902 Kem Kays 10/19/20 Active Low   Other Adverse Reaction (Add comment)              DULAGLUTIDE         Noted Status Severity Type Reaction    03/18/22 0908 Kem Kays 03/18/22 Active                 SULFAMETHOXAZOLE-TRIMETHOPRIM         Noted Status Severity Type Reaction    05/11/22 1403 Kem Kays 05/11/22 Active                 ADHESIVE         Noted Status Severity Type Reaction    07/30/22 1635 [place] 07/30/22 Active Medium  Rash    Comments: OK with paper and silk tape                   I completed my transfer of care / handoff to the receiving personnel during which we discussed:  Airway, All key/critical aspects of case discussed, Analgesia,  Antibiotics, Expectation of post procedure, Fluids/Product, Gave opportunity for questions and acknowledgement of understanding, PMHx and Labs      Post Location: PACU                                                             Last OR Temp: Temperature: 36 C (96.8 F)  ABG:   Airway:* No LDAs found *  Blood pressure  111/68, pulse 84, temperature 36 C (96.8 F), resp. rate (!) 22, height 1.626 m (5\' 4" ), weight 105 kg (231 lb 4.2 oz), SpO2 100%.

## 2022-08-31 ENCOUNTER — Other Ambulatory Visit: Payer: Self-pay

## 2022-08-31 DIAGNOSIS — Z90710 Acquired absence of both cervix and uterus: Secondary | ICD-10-CM

## 2022-08-31 DIAGNOSIS — Z09 Encounter for follow-up examination after completed treatment for conditions other than malignant neoplasm: Secondary | ICD-10-CM

## 2022-08-31 LAB — CBC WITH DIFF
BASOPHIL #: <0.1 10*3/uL (ref <=0.20)
BASOPHIL %: 0.2 %
EOSINOPHIL #: <0.1 10*3/uL (ref <=0.50)
EOSINOPHIL %: 0.3 %
HCT: 37 % (ref 34.8–46.0)
HGB: 12.2 g/dL (ref 11.5–16.0)
IMMATURE GRANULOCYTE #: <0.1 10*3/uL (ref <0.10)
IMMATURE GRANULOCYTE %: 0.5 % (ref 0.0–1.0)
LYMPHOCYTE #: 1.65 10*3/uL (ref 1.00–4.80)
LYMPHOCYTE %: 10.6 %
MCH: 30.7 pg (ref 26.0–32.0)
MCHC: 33 g/dL (ref 31.0–35.5)
MCV: 93.2 fL (ref 78.0–100.0)
MONOCYTE #: 1.27 10*3/uL — ABNORMAL HIGH (ref 0.20–1.10)
MONOCYTE %: 8.2 %
MPV: 10.3 fL (ref 8.7–12.5)
NEUTROPHIL #: 12.46 10*3/uL — ABNORMAL HIGH (ref 1.50–7.70)
NEUTROPHIL %: 80.2 %
PLATELETS: 252 10*3/uL (ref 150–400)
RBC: 3.97 10*6/uL (ref 3.85–5.22)
RDW-CV: 13.3 % (ref 11.5–15.5)
WBC: 15.5 10*3/uL — ABNORMAL HIGH (ref 3.7–11.0)

## 2022-08-31 LAB — POC BLOOD GLUCOSE (RESULTS)
GLUCOSE, POC: 167 mg/dl — ABNORMAL HIGH (ref 65–125)
GLUCOSE, POC: 175 mg/dl — ABNORMAL HIGH (ref 65–125)
GLUCOSE, POC: 134 mg/dl — ABNORMAL HIGH (ref 65–125)

## 2022-08-31 MED ORDER — DOCUSATE SODIUM 100 MG CAPSULE
100.0000 mg | ORAL_CAPSULE | Freq: Two times a day (BID) | ORAL | Status: DC
Start: 2022-08-31 — End: 2023-11-16

## 2022-08-31 MED ORDER — FLUCONAZOLE 150 MG TABLET
150.0000 mg | ORAL_TABLET | Freq: Once | ORAL | 0 refills | Status: AC
Start: 2022-08-31 — End: 2022-09-01
  Filled 2022-08-31: qty 1, 1d supply, fill #0

## 2022-08-31 MED ORDER — ONDANSETRON HCL 4 MG TABLET
4.0000 mg | ORAL_TABLET | Freq: Three times a day (TID) | ORAL | 0 refills | Status: DC | PRN
Start: 2022-08-31 — End: 2022-10-12
  Filled 2022-08-31: qty 10, 4d supply, fill #0

## 2022-08-31 MED ORDER — IBUPROFEN 800 MG TABLET
800.0000 mg | ORAL_TABLET | Freq: Three times a day (TID) | ORAL | 0 refills | Status: DC | PRN
Start: 2022-08-31 — End: 2022-09-14
  Filled 2022-08-31: qty 60, 20d supply, fill #0

## 2022-08-31 MED ORDER — IBUPROFEN 800 MG TABLET
800.0000 mg | ORAL_TABLET | Freq: Three times a day (TID) | ORAL | 1 refills | Status: DC | PRN
Start: 2022-08-31 — End: 2022-09-14
  Filled 2022-08-31: qty 30, 10d supply, fill #0

## 2022-08-31 MED ORDER — HYDROCODONE 5 MG-ACETAMINOPHEN 325 MG TABLET
1.0000 | ORAL_TABLET | ORAL | 0 refills | Status: AC | PRN
Start: 2022-08-31 — End: 2022-09-03
  Filled 2022-08-31: qty 10, 2d supply, fill #0

## 2022-08-31 MED ORDER — CYCLOBENZAPRINE 5 MG TABLET
5.0000 mg | ORAL_TABLET | Freq: Three times a day (TID) | ORAL | 0 refills | Status: DC
Start: 2022-08-31 — End: 2022-10-12
  Filled 2022-08-31: qty 15, 5d supply, fill #0

## 2022-08-31 NOTE — Nurses Notes (Signed)
Patient's WBC 21.6 and HR tachy 110's. Patient has also had a low grade fever of 99. Service made aware. Per service will assess AM labs. No other new orders.

## 2022-08-31 NOTE — Care Management Notes (Signed)
[facility]  Care Management Initial Evaluation    Patient Name: Lynn Gross  Date of Birth: 15-Sep-1975  Sex: female  Date/Time of Admission: 08/30/2022  7:03 AM  Room/Bed: 954/A  Payor: [employer] / Plan: Jefm Miles VACCN/OPTUM / Product Type: Managed Care /   Primary Care Providers:  Pcp, No (General)    Pharmacy Info:   Preferred Pharmacy       [facility] - [place], [place] - [address]    [address] [place] [address]    Phone: [phone] Fax: [phone]    Hours: Not open 24 hours    [facility]    [address] [place] [address]    Phone: [phone] Fax: [phone]    Hours: 24/7          Emergency Contact Info:   Extended Emergency Contact Information  Primary Emergency Contact: Lynn Gross  Address: [address]           [place], [place] [address] [place]  Mobile Phone: [phone]  Relation: Significant other    History:   Lynn Gross is a 46 y.o., female, admitted postoperative state.     Height/Weight: 162.6 cm (5\' 4" ) / 105 kg (231 lb 4.2 oz)     LOS: 0 days   Admitting Diagnosis: Postoperative state [Z98.890]    Assessment:      08/31/22 1039   Assessment Details   Assessment Type Admission   Date of Care Management Update 08/31/22   Date of Next DCP Update 09/01/22   Readmission   Is this a readmission? No   Insurance Information/Type   Insurance type Other (see comments)   Comment (Other Insurance): [facility]   Employment/Financial   Patient has Prescription Coverage?  Yes        Name of Insurance Coverage for Medications [facility]  (Pharmacy- [facility])   Financial Concerns none   Living Environment   Select an age group to open "lives with" row.  Adult   Lives With spouse   Living Arrangements   Counselling psychologist)   Able to Return to Prior Arrangements yes   Home Safety   Home Accessibility stairs to enter home;stairs (1 railing  present);bed and bath on same level   Custody and Legal Status   Do you have a court appointed guardian/conservator? No   Care Management Plan   Discharge Planning Status initial meeting   Projected Discharge Date 08/31/22   Discharge plan discussed with: Patient   CM will evaluate for rehabilitation potential yes   Discharge Needs Assessment   Outpatient/Agency/Support Group Needs   (No previous or current [facility])   Equipment Currently Used at Home none   Equipment Needed After Discharge none   Discharge Facility/Level of Care Needs Home (Patient/Family Member/other)(code 1)   Transportation Available family or friend will provide  (Husband, Lynn Gross, will provide transportation at discharge.)   Referral Information   Admission Type observation   Address Verified verified-no changes   Arrived From home or self-care   ADVANCE DIRECTIVES   Does the Patient have an Advance Directive? Yes, Patient Does Have Advance Directive for Healthcare Treatment   Document the Substance of the Advance Directive (Required) MPOA/Living Will   Type of Advance Directive Completed Medical Living Will;Medical Power of Attorney   Copy of Advance Directives in Chart? Yes, Copy on Chart.(Specify in Comment  Which Advance Directive)   Name of MPOA or Healthcare Surrogate 1) Lynn Gross, 2) Lynn Gross   Phone Number of MPOA or Healthcare Surrogate 1) [phone], 2) [phone]   Patient Requests Assistance in Having Advance Directive Notarized. N/A   LAY CAREGIVER    Appointed Lay Caregiver? I Decline   Home Main Entrance   Stair Railings, Main Entrance railing on right side (ascending)   Number of Stairs, Main Entrance three     MSW met with pt at bedside to complete initial assessment. Pt resides with her husband, Lynn Gross, in a camper located at physical address [address] [place], [place] [address]. Pt stated mailing address is the [address] [place], [place]. Pt stated her husband will provide transportation at discharge. Pt manages  own medications, uses two weekly planners. Pt stated she goes to the [place], sees [name], last visit was last year sometime. Pt stated she sees her Endocrinologist every 3 months, [name]. Pt denied HH, DME other than knee braces and carpal tunnel braces, no O2. Pt has MPOA/Living Will on file in chart. Pt stated she was told she would be discharged today. Will follow for further discharge needs.     Discharge Plan:  Home (Patient/Family Member/other) (code 1)      The patient will continue to be evaluated for developing discharge needs.     Case Manager: Benn Moulder, SOCIAL WORKER  Phone: [phone]

## 2022-08-31 NOTE — Anesthesia Postprocedure Evaluation (Signed)
Anesthesia Post Op Evaluation    Patient: Lynn Gross  Procedure(s) with comments:  SALPINGECTOMY LAPAROSCOPIC  HYSTERECTOMY TOTAL ABDOMINAL - EXTENDED RECOVERY  LYSIS ADHESIONS LAPAROSCOPIC    Last Vitals:Temperature: 36.6 C (97.9 F) (08/31/22 0746)  Heart Rate: 93 (08/31/22 0746)  BP (Non-Invasive): (!) 107/58 (08/31/22 0746)  Respiratory Rate: 16 (08/31/22 0746)  SpO2: 95 % (08/31/22 0746)    No notable events documented.      Patient location during evaluation: PACU       Level of consciousness: awake and alert    Airway patency: patent    Anesthetic complications: no  Cardiovascular status: acceptable  Respiratory status: acceptable  Hydration status: acceptable  Patient post-procedure temperature: Pt Normothermic   PONV Status: Treated

## 2022-08-31 NOTE — Care Plan (Signed)
Problem: Adult Inpatient Plan of Care  Goal: Plan of Care Review  Outcome: Ongoing (see interventions/notes)  Goal: Patient-Specific Goal (Individualized)  Outcome: Ongoing (see interventions/notes)  Goal: Absence of Hospital-Acquired Illness or Injury  Outcome: Ongoing (see interventions/notes)  Intervention: Identify and Manage Fall Risk  Recent Flowsheet Documentation  Taken 08/30/2022 1949 by Hamilton Capri, RN  Safety Promotion/Fall Prevention:   activity supervised   fall prevention program maintained   muscle strengthening facilitated   nonskid shoes/slippers when out of bed   safety round/check completed   toileting scheduled  Intervention: Prevent Skin Injury  Recent Flowsheet Documentation  Taken 08/30/2022 1949 by Hamilton Capri, RN  Skin Protection:   adhesive use limited   tubing/devices free from skin contact  Intervention: Prevent and Manage VTE (Venous Thromboembolism) Risk  Recent Flowsheet Documentation  Taken 08/30/2022 1949 by Hamilton Capri, RN  VTE Prevention/Management: ambulation promoted  Goal: Optimal Comfort and Wellbeing  Outcome: Ongoing (see interventions/notes)  Intervention: Provide Person-Centered Care  Recent Flowsheet Documentation  Taken 08/30/2022 1949 by Hamilton Capri, RN  Trust Relationship/Rapport:   care explained   choices provided   emotional support provided  Goal: Rounds/Family Conference  Outcome: Ongoing (see interventions/notes)     Problem: Pain Acute  Goal: Optimal Pain Control and Function  Outcome: Ongoing (see interventions/notes)  Intervention: Prevent or Manage Pain  Recent Flowsheet Documentation  Taken 08/30/2022 1949 by Hamilton Capri, RN  Sleep/Rest Enhancement: awakenings minimized

## 2022-08-31 NOTE — Progress Notes (Signed)
[facility] Department of Obstetrics & Gynecology      Postoperative Gynecology Progress Note    PATIENT:  Lynn Gross  CHART NUMBER:  [id number]  DATE OF SERVICE:  08/31/2022    SUBJECTIVE:  Lynn Gross is a 47 y.o. [id number] POD #1 s/p TLH converted to TAH and BS. Pt doing well. Pain well controlled. Tolerating PO without N/V. Ambulating and urinating without difficulty. Pt states she has not walked around much but will walk around more today. She is using incentive spirometer. + flatus, - BM. Pt reporting RLQ pain near incision. Describes it as stabbing and burning. Pain worsens with ambulation. Denies CP, SOB, and leg pain.    BG elevated overnight. Pt received one dose of Farxiga but has not gotten any Glipizide.    OBJECTIVE:   Filed Vitals:    08/30/22 1618 08/30/22 1949 08/30/22 2301 08/31/22 0356   BP: (!) 110/52 130/61 (!) 103/54 (!) 101/57   Pulse: 89 (!) 106 (!) 104 96   Resp: 16 16 18 16    Temp: 37.3 C (99.1 F) 37.2 C (99 F) 37.2 C (99 F) 36.8 C (98.2 F)   SpO2: 98% 98% 97% 98%       Tmax:  37.2  UO: 1.1 mL/kg/hr    GENERAL: NAD  CHEST: RRR, CTAB  ABD: Soft, NTTP  INCISION: bandage in place, no surrounding erythema or edema  EXT: No calf tenderness, SCDs in place    LABS:  Results for orders placed or performed during the hospital encounter of 08/30/22 (from the past 24 hour(s))   CBC   Result Value Ref Range    WBC 9.1 3.7 - 11.0 x10^3/uL    RBC 4.77 3.85 - 5.22 x10^6/uL    HGB 14.4 11.5 - 16.0 g/dL    HCT 40.3 47.4 - 25.9 %    MCV 91.2 78.0 - 100.0 fL    MCH 30.2 26.0 - 32.0 pg    MCHC 33.1 31.0 - 35.5 g/dL    RDW-CV 56.3 87.5 - 64.3 %    PLATELETS 289 150 - 400 x10^3/uL    MPV 9.8 8.7 - 12.5 fL   CBC   Result Value Ref Range    WBC 21.6 (H) 3.7 - 11.0 x10^3/uL    RBC 4.02 3.85 - 5.22 x10^6/uL    HGB 12.1 11.5 - 16.0 g/dL    HCT 32.9 51.8 - 84.1 %    MCV 94.0 78.0 - 100.0 fL    MCH 30.1 26.0 - 32.0 pg    MCHC 32.0 31.0 - 35.5 g/dL    RDW-CV 66.0 63.0 - 16.0 %    PLATELETS 267  150 - 400 x10^3/uL    MPV 9.9 8.7 - 12.5 fL   CBC/DIFF    Narrative    The following orders were created for panel order CBC/DIFF.  Procedure                               Abnormality         Status                     ---------                               -----------         ------  CBC WITH DUKG[[id number]]                Abnormal            Final result                 Please view results for these tests on the individual orders.   CBC WITH DIFF   Result Value Ref Range    WBC 15.5 (H) 3.7 - 11.0 x10^3/uL    RBC 3.97 3.85 - 5.22 x10^6/uL    HGB 12.2 11.5 - 16.0 g/dL    HCT 76.2 83.1 - 51.7 %    MCV 93.2 78.0 - 100.0 fL    MCH 30.7 26.0 - 32.0 pg    MCHC 33.0 31.0 - 35.5 g/dL    RDW-CV 61.6 07.3 - 71.0 %    PLATELETS 252 150 - 400 x10^3/uL    MPV 10.3 8.7 - 12.5 fL    NEUTROPHIL % 80.2 %    LYMPHOCYTE % 10.6 %    MONOCYTE % 8.2 %    EOSINOPHIL % 0.3 %    BASOPHIL % 0.2 %    NEUTROPHIL # 12.46 (H) 1.50 - 7.70 x10^3/uL    LYMPHOCYTE # 1.65 1.00 - 4.80 x10^3/uL    MONOCYTE # 1.27 (H) 0.20 - 1.10 x10^3/uL    EOSINOPHIL # <0.10 <=0.50 x10^3/uL    BASOPHIL # <0.10 <=0.20 x10^3/uL    IMMATURE GRANULOCYTE % 0.5 0.0 - 1.0 %    IMMATURE GRANULOCYTE # <0.10 <0.10 x10^3/uL   TYPE AND SCREEN   Result Value Ref Range    UNITS ORDERED NOT STATED     ABO/RH(D) A POSITIVE     ANTIBODY SCREEN NEGATIVE     SPECIMEN EXPIRATION DATE 09/02/2022,2359    POC BLOOD GLUCOSE (RESULTS)   Result Value Ref Range    GLUCOSE, POC 200 (H) 65 - 125 mg/dl   POC BLOOD GLUCOSE (RESULTS)   Result Value Ref Range    GLUCOSE, POC 228 (H) 65 - 125 mg/dl   POC BLOOD GLUCOSE (RESULTS)   Result Value Ref Range    GLUCOSE, POC 207 (H) 65 - 125 mg/dl   POC BLOOD GLUCOSE (RESULTS)   Result Value Ref Range    GLUCOSE, POC 297 (H) 65 - 125 mg/dl         ASSESSMENT/PLAN:  47 y.o. 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     Post-Op Care  - Pain Control Regimen:  Motrin, Tylenol, and  Roxicodone  - Bowel Regimen:  Colace  - Antibiotic Regimen:  None  - Activity: Ad lib  - Diet:  Tolerating PO diet without N/V  - GU: Voiding spontaneously   - Incision:  bandage in place, no surrounding erythema or edema   - Repiratory: breathing comfortably on room air, encouraged incentive spirometer  - DVT ppx: SCDs  - Hb: 14.4 > 12.1 > 12.2    Leukocytosis  - New, likely reactive  - 21.6 post-op > 15.5 this AM  - Pt afebrile and no S/S present    T2DM  - Reg: Lantus 38u AM, glipizide 5 mg BID, farxiga 10 mg daily   - FS/SSI ordered   - BG post op - overnight ranging 207-297  - Will continue to monitor BG today    HLD  - Lipitor    Anxiety/Depression  - Effexor     DISPOSITION: pending RLQ  pain and BG today       Lynn Patricia, DO  [facility]  Department of Obstetrics & Gynecology       I saw and examined the patient.  I reviewed the resident's note.  I agree with the findings and plan of care as documented in the resident's note.  Any exceptions/additions are edited/noted.    Delmar Landau, MD

## 2022-08-31 NOTE — Discharge Summary (Signed)
[facility]  DISCHARGE SUMMARY    PATIENT NAME:  Lynn Gross, Lynn Gross  MRN:  [id number]  DOB:  [birth year]    ENCOUNTER DATE:  08/30/2022  INPATIENT ADMISSION DATE:   DISCHARGE DATE:  08/31/2022    ATTENDING PHYSICIAN: Delmar Landau, MD  SERVICE: GYNECOLOGY  PRIMARY CARE PHYSICIAN: No Pcp       Lay Caregiver Name: Jhonnie Garner Caregiver Contact Number: [phone]   Lay Caregiver Relationship to patient: spouse/significant other    PRIMARY DISCHARGE DIAGNOSIS: Postoperative state  Active Hospital Problems    Diagnosis Date Noted    Principal Problem: Postoperative state [Z98.890] 08/30/2022      Resolved Hospital Problems   No resolved problems to display.     Active Non-Hospital Problems    Diagnosis Date Noted    GAD (generalized anxiety disorder) 03/18/2022           Current Discharge Medication List        START taking these medications.        Details   cyclobenzaprine 5 mg Tablet  Commonly known as: FLEXERIL   5 mg, Oral, 3 TIMES DAILY  Qty: 15 Tablet  Refills: 0     docusate sodium 100 mg Capsule  Commonly known as: COLACE   100 mg, Oral, 2 TIMES DAILY  Qty: 30 Capsule  Refills: 0     fluconazole 150 mg Tablet  Commonly known as: DIFLUCAN   150 mg, Oral, ONCE  Qty: 1 Tablet  Refills: 0     HYDROcodone-acetaminophen 5-325 mg Tablet  Commonly known as: NORCO   1 Tablet, Oral, EVERY 4 HOURS PRN  Qty: 10 Tablet  Refills: 0            CONTINUE these medications which have CHANGED during your visit.        Details   * Ibuprofen 600 mg Tablet  Commonly known as: MOTRIN  What changed: Another medication with the same name was added. Make sure you understand how and when to take each.   600 mg, Oral, 4 TIMES DAILY PRN  Refills: 0     * Ibuprofen 800 mg Tablet  Commonly known as: MOTRIN  What changed: You were already taking a medication with the same name, and this prescription was added. Make sure you understand how and when to take each.   800 mg, Oral, 3 TIMES DAILY PRN  Qty: 60 Tablet  Refills: 0     *  Ibuprofen 800 mg Tablet  Commonly known as: MOTRIN  What changed: You were already taking a medication with the same name, and this prescription was added. Make sure you understand how and when to take each.   800 mg, Oral, 3 TIMES DAILY PRN  Qty: 30 Tablet  Refills: 1     * ondansetron 4 mg Tablet  Commonly known as: ZOFRAN  What changed: Another medication with the same name was added. Make sure you understand how and when to take each.   4 mg, Oral, DAILY PRN  Refills: 0     * ondansetron 4 mg Tablet  Commonly known as: ZOFRAN  What changed: You were already taking a medication with the same name, and this prescription was added. Make sure you understand how and when to take each.   4 mg, Oral, EVERY 8 HOURS PRN  Qty: 10 Tablet  Refills: 0           * This list has 5 medication(s) that are  the same as other medications prescribed for you. Read the directions carefully, and ask your doctor or other care provider to review them with you.                CONTINUE these medications - NO CHANGES were made during your visit.        Details   atorvastatin 10 mg Tablet  Commonly known as: LIPITOR   5 mg, Oral, DAILY  Refills: 0     cyanocobalamin 1,000 mcg Tablet  Commonly known as: VITAMIN B 12   1,000 mcg  Refills: 0     dapagliflozin propanediol 10 mg Tablet  Commonly known as: FARXIGA   10 mg, Oral, EVERY MORNING  Refills: 0     dicyclomine 10 mg Capsule  Commonly known as: BENTYL   10 mg, Oral, DAILY PRN  Refills: 0     glipiZIDE 5 mg Tablet  Commonly known as: GLUCOTROL   5 mg, Oral, 2 TIMES DAILY BEFORE MEALS  Refills: 0     insulin glargine-yfgn 100 Units/mL Insulin Pen   38 Units, Subcutaneous, EVERY MORNING  Refills: 0     venlafaxine 37.5 mg Capsule, Sust. Release 24 hr  Commonly known as: EFFEXOR XR   37.5 mg, Oral, DAILY  Refills: 0     VITAMIN D-3 ORAL   Oral, DAILY  Refills: 0            Discharge med list refreshed?  YES     Allergies   Allergen Reactions    Adhesive Rash     OK with paper and silk tape     Metformin Diarrhea    Bactrim [Sulfamethoxazole-Trimethoprim]     Duloxetine  Other Adverse Reaction (Add comment)    Trulicity [Dulaglutide]     Empagliflozin  Other Adverse Reaction (Add comment)    Iodinated Contrast Media Itching     HOSPITAL PROCEDURE(S):   No orders of the defined types were placed in this encounter.    Surgical/Procedural Cases on this Admission       Case IDs Date Procedure Surgeon Location Status    [id number] 08/30/22 SALPINGECTOMY LAPAROSCOPICHYSTERECTOMY TOTAL ABDOMINALLYSIS ADHESIONS LAPAROSCOPIC Delmar Landau, MD [place] [facility]          REASON FOR HOSPITALIZATION AND HOSPITAL COURSE   BRIEF HPI:  This is a 47 y.o., female s/p total laparoscopic hysterectomy bilateral salpingectomy that was converted to a total abdominal hysterectomy.    BRIEF HOSPITAL NARRATIVE:    Patient admitted to observation for pain control and monitoring s/p total laparoscopic hysterectomy bilateral salpingectomy that was converted to a total abdominal hysterectomy. Patient experienced some hyperglycemia on POD #0 and overnight however, this had improved by POD #1 with resumption of her home medications. Patients pain controlled and she is passing gas. Patient able to ambulate in the hallways with assistance and voiding spontaneously. Her dressings are c/d/i. She has met discharge criteria and would like to go home today. She will follow up with Dr. Titus Dubin on 09/14/2022 as scheduled.     CONDITION ON DISCHARGE:  A. Ambulation: Full ambulation  B. Self-care Ability: Complete  C. Cognitive Status Alert and Oriented x 3  D. Code status at discharge:       LINES/DRAINS/WOUNDS AT DISCHARGE:   Patient Lines/Drains/Airways Status       Active Line / Dialysis Catheter / Dialysis Graft / Drain / Airway / Wound       Name Placement date Placement time Site Days  Wound  Incision Lower;Medial Abdomen 08/30/22  1012  -- 1                    DISCHARGE DISPOSITION:  Home discharge  DISCHARGE INSTRUCTIONS:  Post-Discharge  Follow Up Appointments       Tuesday Sep 14, 2022    Return Patient Visit with Delmar Landau, MD at 11:15 AM      OB/GYN, [facility]  [facility] [place]  [address]  [place] [address]  [phone]             DISCHARGE INSTRUCTION - MISC    Discharge Guidelines for Gynecologic Surgery      Follow up Appointment  You will need to be seen in the doctor's office in 2 weeks after you are discharged home from the hospital.  A post-op follow-up appointment has been made for you at the [facility] office.  Please call if you need to change your appointment [phone]    Activity  No heavy lifting over 5-10 lbs. for two-three weeks.  Do not strain or bear down for bowel movements.  Walk frequently and increase your activity gradually each day as tolerated (except for heavy lifting as described above).  Do not drive a car for 5-7 days or as long as you are taking prescription pain medication.  Shower daily allowing the incision to completely dry after showering.  No tub baths or swimming until incisions are completely healed.    Diet  Return to your regular diet as tolerated.  Resume medications you were taking before surgery unless otherwise advised by your surgeon.  Take the medications that the surgeon prescribed for the time period indicated for prevention of constipation.  Prescription pain medications can slow down the bowel and will cause constipation.  Depending on your usual bowel function and how much pain medication you are taking at home will depend on the degree of constipation.     Care of the Incision  On the incision site, the surgeon sometimes uses a surgical grade glue (i.e. Dermabond) for closing.  The glue should fall off on its own.  If it is still there after 2 weeks, it can be removed by using soap and water and gentle scrubbing with a washcloth in the shower.    Other Instructions for Home Care    The abdominal swelling is common and will  usually resolve within a few weeks.  Do not use oils, powders, or lotions on your incision.  Do not put anything in your vagina (i.e. tampons, douches) until your doctor says it's safe to do so. Do not have sexual intercourse. Do not do any of these things for 6 weeks.  Ask your doctor when you can return to work.    When to call the Doctor  Fever of 100.5 F (38.1 C) or higher  Chills  Bright red vaginal bleeding or vaginal bleeding that soaks more than one pad per hour  Redness, hardening, swelling, drainage or pain at incision sites  A foul smelling discharge from the vagina  Uncontrolled/continuous nausea, vomiting or diarrhea  Pain which is not relieved by medications  Trouble urinating or burning when you urinate      How to Prevent and Treat Constipation     To Prevent Constipation   You may have trouble with bowel movements after you go home. Drink plenty of liquids. Avoid caffeine drinks as they may dehydrate you. Being up and about is helpful  as well. Narcotic pain pills will cause constipation. Take a stool softener (docusate sodium/Colace) 100 mg twice daily and/or Miralax 17 gm once daily while on narcotics. You can buy this without a prescription at the drugstore.     Treating Constipation   If you have no bowel movement within 48 hours after leaving the hospital follow these instructions:     - Increase Miralax to twice daily dosing or take milk of magnesia 2-4 tablespoonsful     - Take a rectal suppository, like Dulcolax. You should have a bowel movement within 4-6 hours.     PLEASE KEEP FOLLOW-UP APPT ALREADY SCHEDULED IN OB[facility]GYN[facility]    Please keep your appointment with your follow-up provider. If for some reason you need to cancel, be sure to reschedule as this is important for your care.     Department [facility] [[id number]]           Evelina Bucy, PA-C    Copies sent to Care Team         Relationship Specialty Notifications Start End    Pcp, No PCP - General   08/05/22              Referring providers can utilize [email or web address] to access their referred [facility] patient's information.                I reviewed the APP's note.  I agree with the findings and plan of care as documented in the APP's note.  Any exceptions/additions are edited/noted.    Delmar Landau, MD

## 2022-08-31 NOTE — Care Plan (Signed)
Patient discharged home with family.  AVS reviewed with patient/care giver.  A written copy of the AVS and discharge instructions was given to the patient/care giver. Patient provided with abdominal binder and educated on use. Demonstrated teaching back to RN. IV removed and belongings packed. Questions sufficiently answered as needed.  Patient/care giver encouraged to follow up with PCP as indicated.  In the event of an emergency, patient/care giver instructed to call 911 or go to the nearest emergency room.    Problem: Adult Inpatient Plan of Care  Goal: Plan of Care Review  Outcome: Adequate for Discharge  Goal: Patient-Specific Goal (Individualized)  Outcome: Adequate for Discharge  Goal: Absence of Hospital-Acquired Illness or Injury  Outcome: Adequate for Discharge  Goal: Optimal Comfort and Wellbeing  Outcome: Adequate for Discharge  Goal: Rounds/Family Conference  Outcome: Adequate for Discharge     Problem: Pain Acute  Goal: Optimal Pain Control and Function  Outcome: Adequate for Discharge

## 2022-09-03 DIAGNOSIS — D251 Intramural leiomyoma of uterus: Secondary | ICD-10-CM

## 2022-09-03 DIAGNOSIS — D252 Subserosal leiomyoma of uterus: Secondary | ICD-10-CM

## 2022-09-03 LAB — SURGICAL PATHOLOGY SPECIMEN

## 2022-09-05 ENCOUNTER — Encounter (INDEPENDENT_AMBULATORY_CARE_PROVIDER_SITE_OTHER): Payer: Self-pay | Admitting: OBSTETRICS/GYNECOLOGY

## 2022-09-14 ENCOUNTER — Other Ambulatory Visit: Payer: Self-pay

## 2022-09-14 ENCOUNTER — Ambulatory Visit (INDEPENDENT_AMBULATORY_CARE_PROVIDER_SITE_OTHER): Payer: 59 | Admitting: OBSTETRICS/GYNECOLOGY

## 2022-09-14 ENCOUNTER — Encounter (INDEPENDENT_AMBULATORY_CARE_PROVIDER_SITE_OTHER): Payer: Self-pay | Admitting: OBSTETRICS/GYNECOLOGY

## 2022-09-14 VITALS — BP 112/70 | HR 82 | Temp 97.8°F | Ht 64.0 in | Wt 228.4 lb

## 2022-09-14 DIAGNOSIS — Z09 Encounter for follow-up examination after completed treatment for conditions other than malignant neoplasm: Secondary | ICD-10-CM

## 2022-09-14 NOTE — Progress Notes (Signed)
Postop visit    The patient is a 47 year old female gravida 3 para 2012 who underwent operative laparoscopy with bilateral salpingectomy and total abdominal hysterectomy for a fibroid uterus causing urinary outflow obstruction.    Pathology revealed an enlarged 360 g fibroid uterus and benign fallopian tubes bilaterally.      Patient states that she is doing well. No complaints. No vaginal bleeding or pelvic pain. Energy is improving. No menopausal symptoms.    ROS:   Denies any fevers or chills, bladder or bowel complaints. All other systems are negative to review.    Physical Examination:  Vitals:    09/14/22 1036   BP: 112/70   Pulse: 82   Temp: 36.6 C (97.8 F)   TempSrc: Thermal Scan   SpO2: 98%   Weight: 104 kg (228 lb 6.3 oz)   Height: 1.626 m (5\' 4" )   BMI: 39.29             General: Pleasant female in NAD  Skin: intact, without lesions  Abdomen: Soft, nontender, nondistended,no masses,  no guarding, no rebound, no peritoneal signs   Incision: clean and dry and intact without evidence of erythema or induration  Neurologic: Alert and oriented  Psychiatric: Mood stable      Gynecologic:    Deferred today.    Assessment/plan:      ICD-10-CM    1. Surgical follow-up care  Z09            No orders of the defined types were placed in this encounter.      1.  Postoperative visit    The patient is doing well postoperatively. Her pathology was discussed. We will continue the current management and she will followup in for weeks for her final postoperative evaluation. Postoperative restrictions discussed.    Delmar Landau, MD

## 2022-10-12 ENCOUNTER — Encounter (INDEPENDENT_AMBULATORY_CARE_PROVIDER_SITE_OTHER): Payer: Self-pay | Admitting: OBSTETRICS/GYNECOLOGY

## 2022-10-12 ENCOUNTER — Ambulatory Visit (INDEPENDENT_AMBULATORY_CARE_PROVIDER_SITE_OTHER): Payer: Self-pay | Admitting: OBSTETRICS/GYNECOLOGY

## 2022-10-12 ENCOUNTER — Other Ambulatory Visit: Payer: Self-pay

## 2022-10-12 VITALS — BP 126/68 | HR 96 | Temp 96.9°F | Ht 65.0 in | Wt 228.0 lb

## 2022-10-12 DIAGNOSIS — Z09 Encounter for follow-up examination after completed treatment for conditions other than malignant neoplasm: Secondary | ICD-10-CM

## 2022-10-12 MED ORDER — SOLIFENACIN 10 MG TABLET
10.0000 mg | ORAL_TABLET | Freq: Every day | ORAL | 3 refills | Status: AC
Start: 2022-10-12 — End: 2023-10-12

## 2022-10-12 NOTE — Progress Notes (Signed)
Postop visit    The patient is a [name], gravida 3 para 2012 who is status post operative laparoscopy, bilateral salpingectomy and total abdominal hysterectomy for large fibroid uterus that was previously causing urinary outflow obstruction.    She now notes a previously she was having some dribbling of urine with cough laugh and sneeze but now notes significant urinary urgency with cough laugh or sneeze and large volume loss of urine requiring use of pads.  With the history of out flow obstruction we reviewed with the patient this can be a common finding and she is willing to begin therapy for overactive bladder.    Patient states that she is doing well. No complaints. No vaginal bleeding or pelvic pain. Energy is improving. No menopausal symptoms.    ROS:   Denies any fevers or chills, bladder or bowel complaints. All other systems are negative to review.    Physical Examination:  Vitals:    10/12/22 0812   BP: 126/68   Pulse: 96   Temp: 36.1 C (96.9 F)   TempSrc: Thermal Scan   SpO2: 95%   Weight: 103 kg (227 lb 15.3 oz)   Height: 1.651 m (5\' 5" )   BMI: 38.01             General: Pleasant female in NAD  Skin: intact, without lesions  Abdomen: Soft, nontender, nondistended,no masses,  no guarding, no rebound, no peritoneal signs   Incision: clean and dry and intact without evidence of erythema or induration  Neurologic: Alert and oriented  Psychiatric: Mood stable      Gynecologic:    Vulva: Without lesions   Vagina: Cuff intact without lesions or evidence of infection  Cervix:  Absent  Uterus:  Absent  Adnexa:  Nontender without mass     Assessment/plan:      ICD-10-CM    1. Surgical follow-up care  Z09            Orders Placed This Encounter    solifenacin (VESICARE) 10 mg Oral Tablet       1.  Postoperative visit    The patient is doing well postoperatively. Her pathology was discussed. We will continue the current management and she will begin therapy for her overactive bladder symptoms  with VESIcare 10 mg daily.  She will follow up in 3 months for further evaluation of her symptoms.  Since the patient had bladder outflow obstruction prior to her surgery these symptoms may have been masked with the enlarged fibroid uterus. Postoperative restrictions discussed.  She will cleared to engage in intercourse in 2 weeks.    Delmar Landau, MD

## 2022-10-23 ENCOUNTER — Encounter (INDEPENDENT_AMBULATORY_CARE_PROVIDER_SITE_OTHER): Payer: Self-pay | Admitting: OBSTETRICS/GYNECOLOGY

## 2022-11-07 ENCOUNTER — Encounter (INDEPENDENT_AMBULATORY_CARE_PROVIDER_SITE_OTHER): Payer: Self-pay | Admitting: OBSTETRICS/GYNECOLOGY

## 2023-01-25 ENCOUNTER — Ambulatory Visit (INDEPENDENT_AMBULATORY_CARE_PROVIDER_SITE_OTHER): Payer: Self-pay | Admitting: OBSTETRICS/GYNECOLOGY

## 2023-02-08 ENCOUNTER — Encounter (HOSPITAL_COMMUNITY): Payer: Self-pay

## 2023-02-21 ENCOUNTER — Other Ambulatory Visit (INDEPENDENT_AMBULATORY_CARE_PROVIDER_SITE_OTHER): Payer: PRIVATE HEALTH INSURANCE

## 2023-02-21 ENCOUNTER — Ambulatory Visit: Payer: PRIVATE HEALTH INSURANCE | Admitting: Family

## 2023-02-21 ENCOUNTER — Encounter (INDEPENDENT_AMBULATORY_CARE_PROVIDER_SITE_OTHER): Payer: Self-pay | Admitting: Family

## 2023-02-21 ENCOUNTER — Other Ambulatory Visit: Payer: Self-pay

## 2023-02-21 VITALS — BP 139/74 | HR 110 | Temp 98.1°F | Resp 96 | Ht 65.0 in | Wt 231.3 lb

## 2023-02-21 DIAGNOSIS — M545 Low back pain, unspecified: Secondary | ICD-10-CM

## 2023-02-21 DIAGNOSIS — M47816 Spondylosis without myelopathy or radiculopathy, lumbar region: Secondary | ICD-10-CM

## 2023-02-21 DIAGNOSIS — M62838 Other muscle spasm: Secondary | ICD-10-CM

## 2023-02-21 DIAGNOSIS — M62830 Muscle spasm of back: Secondary | ICD-10-CM

## 2023-02-21 LAB — POC URINALYSIS (RESULTS)
BILIRUBIN: NEGATIVE mg/dL
BLOOD: NEGATIVE mg/dL
GLUCOSE: >=1000 mg/dL — AB
KETONES: 15 mg/dL — AB
LEUKOCYTES: NEGATIVE WBCs/uL
NITRITE: NEGATIVE
PH: 5.5 (ref 5.0–8.0)
PROTEIN: NEGATIVE mg/dL
SPECIFIC GRAVITY: 1.015 (ref 1.005–1.030)
UROBILINOGEN: 0.2 mg/dL

## 2023-02-21 MED ORDER — NAPROXEN 500 MG TABLET
500.0000 mg | ORAL_TABLET | Freq: Two times a day (BID) | ORAL | 0 refills | Status: AC
Start: 2023-02-21 — End: 2023-03-07

## 2023-02-21 MED ORDER — KETOROLAC 30 MG/ML (1 ML) INJECTION SOLUTION
30.0000 mg | Freq: Once | INTRAMUSCULAR | Status: AC
Start: 2023-02-21 — End: 2023-02-21
  Administered 2023-02-21: 30 mg via INTRAMUSCULAR

## 2023-02-21 MED ORDER — CYCLOBENZAPRINE 10 MG TABLET
10.0000 mg | ORAL_TABLET | Freq: Three times a day (TID) | ORAL | 0 refills | Status: AC
Start: 2023-02-21 — End: 2023-03-03

## 2023-02-21 NOTE — Progress Notes (Signed)
Attending physician: Dr. Edwina Barth  History of Present Illness: Lynn Gross is a 48 y.o. female who presents to the Urgent Care today with chief complaint of    Chief Complaint              Back Pain           Pt presents to [facility] today with c/o back pain onset 1 week ago. Pt localizes her back pain to her lower back. Pt describes the pain as persistent but not too severe. Pt reports sitting and positional changes worsens her pain while laying down helps with improvement. Pt states her back pain occasionally radiates up to her neck and into her R hip. Pt says she has had mostly normal bowel movements but suffered from some constipation over the weekend. Pt notes she has Hx of IBS and compressed discs with no Hx of kidney stones. Pt states she is not on any blood thinners currently.  Pt denies incontinence and saddle anesthesia.    Review Flowsheet  More data may exist         08/30/2022   FUNCTIONAL HEALTH SCREENING   How often do you have a problem understanding what is told to you about your medical condition?  Never   How often do you see or talk to people that you care about and feel close to? (For example: talking to friends on the phone, visiting friends or family, going to church or club meetings) >=5x a wk     I reviewed and confirmed the patient's past medical history taken by the nurse or medical assistant with the addition of the following:    Past Medical History:    Past Medical History:   Diagnosis Date    Anxiety     Astigmatism     Depression     Diabetes mellitus (CMS HCC)     Esophageal reflux     Fibromyalgia     History of anesthesia complications     woke up early, shaking    Irritable bowel syndrome     PCOS (polycystic ovarian syndrome)     PONV (postoperative nausea and vomiting)     Type 2 diabetes mellitus (CMS HCC)     Wears glasses      Past Surgical History:    Past Surgical History:   Procedure Laterality Date    Hx acl reconstruction Right     Hx  cesarean section      Hx hysterectomy      Hx meniscectomy Right     Hx tubal ligation      Knee cartilage surgery Right      Allergies:  Allergies   Allergen Reactions    Adhesive Rash     OK with paper and silk tape    Metformin Diarrhea    Bactrim [Sulfamethoxazole-Trimethoprim]     Duloxetine  Other Adverse Reaction (Add comment)    Trulicity [Dulaglutide]     Empagliflozin  Other Adverse Reaction (Add comment)    Iodinated Contrast Media Itching     Medications:    Current Outpatient Medications   Medication Sig    atorvastatin (LIPITOR) 10 mg Oral Tablet Take 0.5 Tablets (5 mg total) by mouth Once a day    calcium carbonate/vitamin D3 (VITAMIN D-3 ORAL) Take by mouth Once a day    cyanocobalamin (VITAMIN B 12) 1,000 mcg Oral Tablet 1 Tablet (1,000 mcg total)    cyclobenzaprine (FLEXERIL) 10 mg Oral  Tablet Take 1 Tablet (10 mg total) by mouth Three times a day for 10 days    dapagliflozin propanediol (FARXIGA) 10 mg Oral Tablet Take 1 Tablet (10 mg total) by mouth Every morning    dicyclomine (BENTYL) 10 mg Oral Capsule Take 1 Capsule (10 mg total) by mouth Once per day as needed    docusate sodium (COLACE) 100 mg Oral Capsule Take 1 Capsule (100 mg total) by mouth Twice daily    glipiZIDE (GLUCOTROL) 5 mg Oral Tablet Take 1 Tablet (5 mg total) by mouth Twice a day before meals    insulin glargine-yfgn 100 Units/mL Subcutaneous Insulin Pen Inject 38 Units under the skin Every morning    naproxen (NAPROSYN) 500 mg Oral Tablet Take 1 Tablet (500 mg total) by mouth Twice daily with food for 14 days    ondansetron (ZOFRAN) 4 mg Oral Tablet Take 1 Tablet (4 mg total) by mouth Once per day as needed    solifenacin (VESICARE) 10 mg Oral Tablet Take 1 Tablet (10 mg total) by mouth Once a day Indications: overactive bladder    venlafaxine (EFFEXOR XR) 37.5 mg Oral Capsule, Sust. Release 24 hr Take 1 Capsule (37.5 mg total) by mouth Once a day     Social History:    Social History     Tobacco Use    Smoking status:  Former     Current packs/day: 0.00     Types: Cigarettes     Quit date: 2009     Years since quitting: 16.1    Smokeless tobacco: Never   Vaping Use    Vaping status: Never Used   Substance Use Topics    Alcohol use: Never    Drug use: Never     Family History:  Family Medical History:    None       Review of Systems:    Gastrointestinal: +constipation, no incontinence  Musculoskeletal: +back pain  Neurologic: no saddle anesthesia  All other review of systems were negative    Physical Exam:  Vital signs:   Vitals:    02/21/23 1651   BP: 139/74   Pulse: (!) 110   Resp: (!) 96   Temp: 36.7 C (98.1 F)   Weight: 105 kg (231 lb 4.8 oz)   Height: 1.651 m (5\' 5" )   BMI: 38.49     Body mass index is 38.49 kg/m. Facility age limit for growth %iles is 20 years.  Patient's last menstrual period was 07/17/2022.    General:  No acute distress  Head:  Normocephalic and atraumatic   Eyes:  Normal lids/lashes, EOMI and normal conjunctiva  Pulmonary: No respiratory distress, Equal breath sounds, no rales and no rhonchi   Cardiovascular:  Regular rate/rhythm   Gastrointestinal: normal bowel sounds, No rebound or guarding  Genitourinary: Chaperoned by Nurse Eileen Stanford, Good rectal tone  Musculoskeletal: Tightness to lumbar area, TTP L2, L3, and L4, Negative SLR, Good deep tendon reflexes, Neurovascularly intact with good sensation and DTR+2  Skin:  No visible rash on exposed skin  Psychiatric:  Appropriate affect and behavior  Neurologic:   Alert and oriented x 3    Data Reviewed:    Radiography:  Lumbar Spine Series x-ray  Reviewed by Ernestina Columbia APRN, NP-C on 02/21/2023 at 17:33.     Narrative & Impression   Lynn Gross  Female, 48 years old.     XR LUMBAR SPINE AP/OBLIQUES/LAT/SPOT performed on 02/21/2023 5:33 PM.     REASON FOR  EXAM:  M54.50: Low back pain  M62.838: Muscle spasm     TECHNIQUE: 5 views/5 images submitted for interpretation.     COMPARISON:  None     FINDINGS:  There is mild spondylosis most pronounced at the  lumbosacral junction and mild straightening of the lumbar lordosis. The vertebral body heights are maintained. No spondylolisthesis or spondylolysis. Mild hypertrophic facet arthropathy within the lower lumbar spine     Radiography:  KUB and Upright Abdomen x-ray  Reviewed by Ernestina Columbia APRN, NP-C on 02/21/2023 at 17:33.     Narrative & Impression   Abbie NICOLE Canning  Female, 48 years old.     XR KUB AND UPRIGHT ABDOMEN performed on 02/21/2023 5:33 PM.     REASON FOR EXAM:  M54.50: Low back pain  M62.838: Muscle spasm     TECHNIQUE: 2 views/3 images submitted for interpretation.     COMPARISON:  None     FINDINGS:  Gas and stool-filled nondilated loops of bowel are noted to the level the rectal vault. There is improved colonic stool burden. No free air in upright imaging.     IMPRESSION:  Nonobstructed bowel gas pattern     Course: Condition at discharge: Good     Differential Diagnosis: Sciatica vs UTI vs Kidney Stone    Assessment:   Assessment/Plan   1. Low back pain    2. Muscle spasm      Plan:    Orders Placed This Encounter    Lumbar spine series    XR KUB AND UPRIGHT ABDOMEN    ketorolac (TORADOL) 30 mg/mL injection    cyclobenzaprine (FLEXERIL) 10 mg Oral Tablet    naproxen (NAPROSYN) 500 mg Oral Tablet     Prescription(s) given for Flexeril and Naprosyn, given directions on proper use.  Pt administered Toradol in clinic, pt tolerated well.  Lumbar Spine Series x-ray performed in clinic, see data reviewed. Imaging discussed with patient.  KUB and Upright Abdomen x-ray performed in clinic, see data reviewed. Imaging discussed with patient.  Advised follow up with PCP. Discussed possible benefits from physical therapy.     Go to Emergency Department immediately for further work up if any concerning symptoms or advanced imaging if worsens or no improvement.   Plan was discussed and patient verbalized understanding. If symptoms are worsening or not improving the patient should return to the urgent care for  further evaluation.    I am scribing for, and in the presence of, Camaria Gerald [place], NP-C for services provided on 02/21/2023.  Gust Brooms, SCRIBE   Alex [place], SCRIBE 02/21/2023, 17:01    I personally performed the services described in this documentation, as scribed  in my presence, and it is both accurate  and complete.    Sebastien Jackson [place], APRN,NP-C

## 2023-02-21 NOTE — Nursing Note (Signed)
Patient has given verbal permission for the scribe to assist the healthcare provider during the patient's visit.Jana Half, RN 02/21/2023, 16:51

## 2023-03-29 ENCOUNTER — Ambulatory Visit (INDEPENDENT_AMBULATORY_CARE_PROVIDER_SITE_OTHER): Payer: Self-pay | Admitting: OBSTETRICS/GYNECOLOGY

## 2023-05-01 ENCOUNTER — Encounter (INDEPENDENT_AMBULATORY_CARE_PROVIDER_SITE_OTHER): Payer: Self-pay | Admitting: OBSTETRICS/GYNECOLOGY

## 2023-05-03 ENCOUNTER — Ambulatory Visit (INDEPENDENT_AMBULATORY_CARE_PROVIDER_SITE_OTHER): Payer: PRIVATE HEALTH INSURANCE | Admitting: OBSTETRICS/GYNECOLOGY

## 2023-05-26 ENCOUNTER — Encounter: Payer: Medicaid (Managed Care) | Attending: Family Medicine | Primary: Family Medicine

## 2023-06-27 ENCOUNTER — Other Ambulatory Visit (HOSPITAL_COMMUNITY): Payer: Self-pay | Admitting: HOSPITAL - GENERAL ACUTE CARE

## 2023-06-27 DIAGNOSIS — R29818 Other symptoms and signs involving the nervous system: Secondary | ICD-10-CM

## 2023-06-29 ENCOUNTER — Other Ambulatory Visit: Payer: Self-pay

## 2023-06-29 ENCOUNTER — Ambulatory Visit: Attending: Neurology | Admitting: Neurology

## 2023-06-29 ENCOUNTER — Encounter (INDEPENDENT_AMBULATORY_CARE_PROVIDER_SITE_OTHER): Payer: Self-pay | Admitting: Neurology

## 2023-06-29 VITALS — BP 128/73 | HR 103 | Ht 64.5 in | Wt 228.6 lb

## 2023-06-29 DIAGNOSIS — R29818 Other symptoms and signs involving the nervous system: Secondary | ICD-10-CM | POA: Insufficient documentation

## 2023-06-29 DIAGNOSIS — R413 Other amnesia: Secondary | ICD-10-CM | POA: Insufficient documentation

## 2023-06-29 NOTE — H&P (Signed)
 Date:  06/29/2023  Name: Lynn Gross  Age:  48 y.o.  Referring Physician:   [facility]  [address]  [place],  [place] [address]    Chief Complaint:   Chief Complaint   Patient presents with    New Patient       History obtained from: Patient and husband    History of Present Illness  This is a 48 y.o. right-handed White woman referred for memory disturbance. She states she had COVID in December 2020 and since then she has had a lot of trouble thinking, focusing and with memory. 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 She states the is a big change from when she was going for her masters as to how classes and a day of study will go. Husband states he has noticed a difference as well and she sometimes gets into a cycle of thoughts that keep recurring and unable to get her thought across. They share a lot of responsibilities so they have not noticed trouble with getting bills paid. She does not drive much but denies getting lost. She states they recently checked her thyroid but she is not sure of the results. She is on B12 replacement because it was noted to be low in 2022, but she does not know the exact numbers and testing was not done here.     Outpatient Medications Marked as Taking for the 06/29/23 encounter (Office Visit) with Melanie Lenis, MD   Medication Sig    atorvastatin  (LIPITOR) 10 mg Oral Tablet Take 0.5 Tablets (5 mg total) by mouth Once a day    calcium carbonate/vitamin D3 (VITAMIN D-3 ORAL) Take by mouth Once a day    cyanocobalamin (VITAMIN B 12) 1,000 mcg Oral Tablet 1 Tablet (1,000 mcg total)    dicyclomine (BENTYL) 10 mg Oral Capsule Take 1 Capsule (10 mg total) by mouth Once per day as needed    docusate sodium  (COLACE) 100 mg Oral Capsule Take 1 Capsule (100 mg total) by mouth Twice daily    glipiZIDE  (GLUCOTROL ) 5 mg Oral Tablet Take 1 Tablet (5 mg total) by mouth Twice a day before meals     insulin  glargine-yfgn 100 Units/mL Subcutaneous Insulin  Pen Inject 38 Units under the skin Every morning    ondansetron  (ZOFRAN ) 4 mg Oral Tablet Take 1 Tablet (4 mg total) by mouth Once per day as needed    solifenacin  (VESICARE ) 10 mg Oral Tablet Take 1 Tablet (10 mg total) by mouth Once a day Indications: overactive bladder    venlafaxine  (EFFEXOR  XR) 37.5 mg Oral Capsule, Sust. Release 24 hr Take 1 Capsule (37.5 mg total) by mouth Once a day     Allergies[1]  Past Medical History:   Diagnosis Date    Anxiety     Astigmatism     Depression     Diabetes mellitus     Esophageal reflux     Fibromyalgia     History of anesthesia complications     woke up early, shaking    Irritable bowel syndrome     PCOS (polycystic ovarian syndrome)     PONV (postoperative nausea and vomiting)     Type 2 diabetes mellitus     Wears glasses          Past Surgical History:   Procedure Laterality Date    HX ACL RECONSTRUCTION Right     HX  CESAREAN SECTION      x2    HX HYSTERECTOMY      HX MENISCECTOMY Right     HX TUBAL LIGATION      KNEE CARTILAGE SURGERY Right          Family Medical History:       Problem Relation (Age of Onset)    Anxiety Father            Social History     Socioeconomic History    Marital status: Married   Tobacco Use    Smoking status: Former     Current packs/day: 0.00     Types: Cigarettes     Quit date: 2009     Years since quitting: 16.4    Smokeless tobacco: Never   Vaping Use    Vaping status: Never Used   Substance and Sexual Activity    Alcohol use: Never    Drug use: Never    Sexual activity: Yes     Partners: Male     Birth control/protection: Female Sterilization   Other Topics Concern    Ability to Walk 1 Flight of Steps without SOB/CP Yes    Routine Exercise No    Ability to Walk 2 Flight of Steps without SOB/CP No     Comment: SOB, no CP    Ability To Do Own ADL's Yes    Other Activity Level Yes     Comment: housework   Social History Narrative    Married 1997, 2 children/pregnancies - 1  miscarriage, PhD student in communication studies.      Social Determinants of Health     Social Connections: Low Risk  (08/30/2022)    Social Connections     SDOH Social Isolation: 5 or more times a week       Review of Systems  Constitutional: + night sweats always  Eyes: No visual change  Ear nose and throat: Hearing poor bilateral   Cardiovascular: No chest pain  Respiratory: No shortness of breath  Gastrointestinal: + IBS  Genitourinary: + stress incontinence  Musculoskeletal: + arthritis all over   Skin: No rash  Psychiatric: + depression and anxiety  Endocrine: No increased urination  Hematologic: Normal nodes    Examination:  BP 128/73 (Patient Position: Sitting)   Pulse (!) 103   Ht 1.638 m (5' 4.5)   Wt 104 kg (228 lb 9.9 oz)   LMP 07/17/2022   SpO2 98%   BMI 38.64 kg/m       Exam reveals a patient in no apparent distress.  Head examination demonstrates normocephalic and atraumatic with mucous membranes moist and oropharynx without erythema or lesion.  Neck has full range of motion and is supple.  Lungs are clear to auscultation.  Cardiovascular has regular rate and rhythm.  Fundi are sharp.   No carotid bruits.    Orientation is normal.  Memory is normal.  Attention is normal.  Knowledge is normal.  Language: no aphasia  Speech: no dysarthria  Cranial Nerves:   2,3,4,6: Pupils equally round and reactive. Extra-ocular muscles intact   5: muscles of mastication are strong   7: Face symmetric   8: intact to tuning fork   9, 10: Palate midline   11: accessory muscles are strong   12: tongue is midline.  Gait: Normal tandem  Coordination: Normal finger to nose, fine motor movements and rapid alternating movements.  Sensory: intact to light touch temperature and vibration  Muscle tone: is normal.  Motor: Normal strength, 5/5 throughout.  Reflexes: 2/4 throughout    I have reviewed the following:   [facility].     Assessment    ICD-10-CM    1. Memory disturbance  R41.3 Refer to Neuorpsych Testing       2. Other symptoms and signs involving the nervous system  R29.818 Refer to Neuorpsych Testing          Plan  I found her neurological examination to be normal. She is on B12 replacement and thyroid testing is pending which I suggested she follow up on. She answered questions efficiently and correctly and as she complains mainly of issues with focusing this may be related to her mood disorder at least in part. They both have noticed a change and it is affecting her PhD studies so I am referring her for Neuropsychological Memory testing. She will call me for the results and we will pursue additional testing as appropriate.     On the day of the encounter, a total of  50 minutes was spent on this patient encounter including review of historical information, examination, documentation and post-visit activities. 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     Alm Pack, MD, [facility]  Associate Professor  Department of Neurology         [1]   Allergies  Allergen Reactions    Adhesive Rash     OK with paper and silk tape    Metformin Diarrhea    Bactrim [Sulfamethoxazole-Trimethoprim]     Duloxetine  Other Adverse Reaction (Add comment)    Trulicity [Dulaglutide]     Empagliflozin  Other Adverse Reaction (Add comment)    Iodinated Contrast Media Itching

## 2023-06-30 ENCOUNTER — Encounter (INDEPENDENT_AMBULATORY_CARE_PROVIDER_SITE_OTHER): Payer: Self-pay

## 2023-07-04 ENCOUNTER — Encounter (INDEPENDENT_AMBULATORY_CARE_PROVIDER_SITE_OTHER): Payer: Self-pay

## 2023-07-14 ENCOUNTER — Other Ambulatory Visit: Payer: Self-pay

## 2023-07-15 ENCOUNTER — Ambulatory Visit: Payer: PRIVATE HEALTH INSURANCE | Attending: Neurology

## 2023-07-15 ENCOUNTER — Ambulatory Visit (INDEPENDENT_AMBULATORY_CARE_PROVIDER_SITE_OTHER): Payer: Self-pay

## 2023-07-15 DIAGNOSIS — F429 Obsessive-compulsive disorder, unspecified: Secondary | ICD-10-CM | POA: Insufficient documentation

## 2023-07-15 DIAGNOSIS — R29818 Other symptoms and signs involving the nervous system: Secondary | ICD-10-CM | POA: Insufficient documentation

## 2023-07-15 DIAGNOSIS — R413 Other amnesia: Secondary | ICD-10-CM | POA: Insufficient documentation

## 2023-07-15 DIAGNOSIS — R4189 Other symptoms and signs involving cognitive functions and awareness: Secondary | ICD-10-CM | POA: Insufficient documentation

## 2023-07-15 DIAGNOSIS — F329 Major depressive disorder, single episode, unspecified: Secondary | ICD-10-CM | POA: Insufficient documentation

## 2023-07-15 DIAGNOSIS — F411 Generalized anxiety disorder: Secondary | ICD-10-CM
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
## 2023-08-01 ENCOUNTER — Other Ambulatory Visit: Payer: Self-pay

## 2023-08-01 ENCOUNTER — Ambulatory Visit: Payer: PRIVATE HEALTH INSURANCE | Attending: Neurology

## 2023-08-01 DIAGNOSIS — R4189 Other symptoms and signs involving cognitive functions and awareness: Secondary | ICD-10-CM | POA: Insufficient documentation

## 2023-08-01 NOTE — Progress Notes (Signed)
 PATIENT NAME: Lynn Gross  MRN: [id number]  DOB: Salley 03, 1977  DOS: 08/01/2023  TIME IN/OUT: 1000/1045 + 10 minutes preparation and documentation    TELEMEDICINE DOCUMENTATION:    Patient Location: Parked car  Patient/family aware of provider location:  Yes  Patient/family consent for telemedicine:  Yes  Examination observed and performed by: Sheryle Moats, PhD Neuropsychology Fellow    Subjective:  Pt. presented for results of the neuropsychological evaluation conducted on 07/15/2023.    Objective: Pt. presented with her husband via Epic Video for feedback regarding their neuropsychological evaluation.  Due to technical difficulties, we switched from Epic Video to phone call. They were provided with all results and recommendations.          Assessment: Pt. did not exhibit any significant symptoms during the feedback session. All questions were answered, and they did not indicate any further questions.     Plan/procedure: Recommendations were provided as listed in the report. They were encouraged to contact me at any time should they have future questions, they have my contact information.     Diagnosis:   Other symptoms and signs involving cognitive functions and awareness  Obsessive Compulsive Disorder per history  Generalized Anxiety Disorder  Major Depressive Disorder   R/O Trauma/stressor-related Disorder      I reviewed the problem list only as it pertains to their cognitive and psychiatric diagnosis.    Sheryle BRAVO. Moats, Ph.D.  Supervised Psychologist/Neuropsychology Fellow  [facility]

## 2023-09-16 ENCOUNTER — Ambulatory Visit (INDEPENDENT_AMBULATORY_CARE_PROVIDER_SITE_OTHER): Payer: Self-pay

## 2023-11-16 ENCOUNTER — Encounter (INDEPENDENT_AMBULATORY_CARE_PROVIDER_SITE_OTHER): Payer: Self-pay

## 2023-11-16 ENCOUNTER — Ambulatory Visit (INDEPENDENT_AMBULATORY_CARE_PROVIDER_SITE_OTHER): Admitting: Rheumatology

## 2023-11-16 ENCOUNTER — Other Ambulatory Visit: Payer: Self-pay

## 2023-11-16 ENCOUNTER — Ambulatory Visit: Payer: Self-pay

## 2023-11-16 DIAGNOSIS — Z8639 Personal history of other endocrine, nutritional and metabolic disease: Secondary | ICD-10-CM

## 2023-11-16 DIAGNOSIS — D1800 Hemangioma unspecified site: Secondary | ICD-10-CM | POA: Insufficient documentation

## 2023-11-16 DIAGNOSIS — K581 Irritable bowel syndrome with constipation: Secondary | ICD-10-CM | POA: Insufficient documentation

## 2023-11-16 DIAGNOSIS — K760 Fatty (change of) liver, not elsewhere classified: Secondary | ICD-10-CM

## 2023-11-16 DIAGNOSIS — R1011 Right upper quadrant pain: Secondary | ICD-10-CM

## 2023-11-16 LAB — HEPATITIS C ANTIBODY SCREEN WITH REFLEX TO HCV PCR: HCV ANTIBODY QUALITATIVE: NEGATIVE

## 2023-11-16 LAB — LIPASE: LIPASE: 138 U/L — ABNORMAL HIGH (ref 10–60)

## 2023-11-16 LAB — CBC
HCT: 46.8 % — ABNORMAL HIGH (ref 34.8–46.0)
HGB: 16.2 g/dL — ABNORMAL HIGH (ref 11.5–16.0)
MCH: 32.5 pg — ABNORMAL HIGH (ref 26.0–32.0)
MCHC: 34.6 g/dL (ref 31.0–35.5)
MCV: 94 fL (ref 78.0–100.0)
MPV: 9.9 fL (ref 8.7–12.5)
PLATELETS: 317 x10ˆ3/uL (ref 150–400)
RBC: 4.98 x10ˆ6/uL (ref 3.85–5.22)
RDW-CV: 12.6 % (ref 11.5–15.5)
WBC: 8.1 x10ˆ3/uL (ref 3.7–11.0)

## 2023-11-16 LAB — COMPREHENSIVE METABOLIC PANEL, NON-FASTING
ALBUMIN: 4.1 g/dL (ref 3.5–5.0)
ALKALINE PHOSPHATASE: 93 U/L (ref 40–110)
ALT (SGPT): 25 U/L (ref <31)
ANION GAP: 10 mmol/L (ref 4–13)
AST (SGOT): 20 U/L (ref 11–34)
BILIRUBIN TOTAL: 0.3 mg/dL (ref 0.3–1.3)
BUN/CREA RATIO: 25 — ABNORMAL HIGH (ref 6–22)
BUN: 18 mg/dL (ref 8–25)
CALCIUM: 9.8 mg/dL (ref 8.6–10.2)
CHLORIDE: 106 mmol/L (ref 96–111)
CO2 TOTAL: 28 mmol/L (ref 22–30)
CREATININE: 0.73 mg/dL (ref 0.60–1.05)
GLUCOSE: 243 mg/dL — ABNORMAL HIGH (ref 65–125)
POTASSIUM: 4.9 mmol/L (ref 3.7–5.3)
PROTEIN TOTAL: 6.9 g/dL (ref 6.0–7.9)
SODIUM: 144 mmol/L (ref 136–145)
eGFRcr - FEMALE: >90 mL/min/1.73mˆ2 (ref >=60)

## 2023-11-16 LAB — IRON TRANSFERRIN AND TIBC
IRON (TRANSFERRIN) SATURATION: 21 % (ref 15–50)
IRON: 81 ug/dL (ref 45–170)
TOTAL IRON BINDING CAPACITY: 382 ug/dL (ref 252–504)
TRANSFERRIN: 273 mg/dL (ref 180–360)

## 2023-11-16 LAB — ALPHA-1-ANTITRYPSIN: ALPHA 1 ANTITRYPSIN: 131 mg/dL (ref 90–200)

## 2023-11-16 LAB — HEPATITIS A (HAV) IGG ANTIBODY: HAV IGG ANTIBODY: REACTIVE — AB

## 2023-11-16 LAB — HEPATITIS B SURFACE ANTIGEN: HBV SURFACE ANTIGEN QUALITATIVE: NEGATIVE

## 2023-11-16 LAB — HEPATITIS B SURFACE ANTIBODY: HBV SURFACE ANTIBODY QUANTITATIVE: 0.54 m[IU]/mL (ref <8.00)

## 2023-11-16 LAB — CERULOPLASMIN: CERULOPLASMIN: 30 mg/dL (ref 18–45)

## 2023-11-16 LAB — GAMMA GT: GGT: 25 U/L (ref <38)

## 2023-11-16 LAB — FERRITIN: FERRITIN: 73 ng/mL (ref 5–200)

## 2023-11-16 LAB — HEPATITIS B CORE ANTIBODY: HBV CORE TOTAL ANTIBODIES: NEGATIVE

## 2023-11-16 NOTE — Patient Instructions (Signed)
 Get blood work and an ultrasound to look for scarring.    Fatty liver can be treated through lifestyle modifications leading to weight reduction. This includes a combination of dietary changes and increased physical activity.   I recommend trying to reduce total calories eaten and trying to limit added sugars (especially fructose), fatty foods, or ultra-processed foods as much as possible.  A Mediterranean-style diet can be helpful.  Patients with fatty liver should not consume alcohol.  Drinking black coffee may help prevent scarring in the liver, but adding cream and sugar may limit its benefit.  I recommend trying to get about 30 minutes of physical activity per day. This can be broken into smaller time intervals, and any activity counts!  A goal weight loss of 5-10% of your body weight is recommended to treat fatty liver. This doesn't have to be done all at once - the goal is to make changes that you can sustain over time.

## 2023-11-16 NOTE — Progress Notes (Signed)
 Gastroenterology and Hepatology   Consult Note      REQUESTING PROVIDER: Honor Blaze MD    REASON FOR CONSULT:   Hemangioma and hepatic steatosis    HISTORY OF PRESENT ILLNESS:  Asusena Adeline Gross is a 48 y.o. female with a history of type 2 diabetes, dyslipidemia, PCOS status post hysterectomy who I am seeing for further evaluation and management of hepatic hemangiomas and hepatic steatosis.  The patient has a known history of liver hemangiomas dating back to 2015.  Per review of a January 2023 CT scan there was enlargement to 7.4 cm in greatest diameter from 4.6 cm in 2015.  Subsequent MRI showed stability of the lesion as well as to smaller hemangiomas (1 in 1.3 cm).  A surveillance ultrasound on September 16, 2023 showed imaging features consistent with a hemangioma measuring up to 4.9 cm in size.  Mild echogenicity was also noted.    The patient's metabolic risk factors are noted above.  The patient was diagnosed with PCOS and approximately 2010 and diabetes within a couple of years after that.  She has been on several agents to help control glucoses including Trulicity.  She notes that this was held temporarily due to an elevated lipase but it is unclear that she was diagnosed with acute pancreatitis.  The patient previously had been following more of a Mediterranean style diet, but had been advised more recently to follow a carnivore diet.  She states that she stopped this after being diagnosed with high cholesterol.    The patient notes occasional sharp right upper quadrant pain.  Sometimes this can be in the front or the back.  There is no associated symptoms with this.  She also will have nausea as well as a feeling of hot rocks in her upper abdomen.  She denies early satiety.  She notes that she tends towards the constipated side and is diagnosed with IBS.[email or web address]  She had a colonoscopy recently that showed 1 benign polyp.  She notes that the recall was recommended for 10 years.    PAST MEDICAL  HISTORY:  Per HPI and  Past Medical History:   Diagnosis Date    Anxiety     Astigmatism     Depression     Diabetes mellitus     Esophageal reflux     Fibromyalgia     History of anesthesia complications     woke up early, shaking    Irritable bowel syndrome     PCOS (polycystic ovarian syndrome)     PONV (postoperative nausea and vomiting)     Type 2 diabetes mellitus     Wears glasses             PAST SURGICAL HISTORY:    Past Surgical History:   Procedure Laterality Date    HX ACL RECONSTRUCTION Right     HX CESAREAN SECTION      x2    HX HYSTERECTOMY      HX MENISCECTOMY Right     HX TUBAL LIGATION      KNEE CARTILAGE SURGERY Right            FAMILY HISTORY:  The patient's family history was reviewed and is pertinent for no liver disease.    SOCIAL HISTORY:  The patient lives in [place].  She is accompanied by her husband who helps provide history.  She is currently working on her PhD.  She anticipates to be done in May and plans to move to [place]   after this.    Social History     Socioeconomic History    Marital status: Married   Tobacco Use    Smoking status: Former     Current packs/day: 0.00     Types: Cigarettes     Quit date: 2009     Years since quitting: 16.8    Smokeless tobacco: Never   Vaping Use    Vaping status: Never Used   Substance and Sexual Activity    Alcohol use: Never    Drug use: Never    Sexual activity: Yes     Partners: Male     Birth control/protection: Female Sterilization   Other Topics Concern    Ability to Walk 1 Flight of Steps without SOB/CP Yes    Routine Exercise No    Ability to Walk 2 Flight of Steps without SOB/CP No     Comment: SOB, no CP    Ability To Do Own ADL's Yes    Other Activity Level Yes     Comment: housework   Social History Narrative    Married 1997, 2 children/pregnancies - 1 miscarriage, PhD student in communication studies.      Social Determinants of Health     Social Connections: Low Risk (08/30/2022)    Social Connections     SDOH Social  Isolation: 5 or more times a week       MEDICATIONS:  Patient's Medications   New Prescriptions    No medications on file   Previous Medications    ATORVASTATIN  (LIPITOR) 10 MG ORAL TABLET    Take 0.5 Tablets (5 mg total) by mouth Once a day       Associated Diagnoses: --    CALCIUM CARBONATE/VITAMIN D3 (VITAMIN D-3 ORAL)    Take by mouth Once a day       Associated Diagnoses: --    CYANOCOBALAMIN (VITAMIN B 12) 1,000 MCG ORAL TABLET    1 Tablet (1,000 mcg total)       Associated Diagnoses: --    DAPAGLIFLOZIN  PROPANEDIOL (FARXIGA ) 10 MG ORAL TABLET    Take 1 Tablet (10 mg total) by mouth Every morning       Associated Diagnoses: --    DICYCLOMINE (BENTYL) 10 MG ORAL CAPSULE    Take 1 Capsule (10 mg total) by mouth Once per day as needed       Associated Diagnoses: --    DOCUSATE SODIUM  (COLACE) 100 MG ORAL CAPSULE    Take 1 Capsule (100 mg total) by mouth Twice daily       Associated Diagnoses: --    DULAGLUTIDE (TRULICITY) 0.75 MG/0.5 ML SUBCUTANEOUS PEN INJECTOR    Inject 0.5 mL (0.75 mg total) under the skin Every 7 days       Associated Diagnoses: --    GLIPIZIDE  (GLUCOTROL ) 5 MG ORAL TABLET    Take 1 Tablet (5 mg total) by mouth Twice a day before meals       Associated Diagnoses: --    INSULIN  GLARGINE-YFGN 100 UNITS/ML SUBCUTANEOUS INSULIN  PEN    Inject 38 Units under the skin Every morning       Associated Diagnoses: --    ONDANSETRON  (ZOFRAN ) 4 MG ORAL TABLET    Take 1 Tablet (4 mg total) by mouth Once per day as needed       Associated Diagnoses: --    VENLAFAXINE  (EFFEXOR  XR) 37.5 MG ORAL CAPSULE, SUST. RELEASE 24 HR    Take 1 Capsule (37.5  mg total) by mouth Once a day       Associated Diagnoses: --    VITAMIN B COMPLEX ORAL TABLET    Take 1 Tablet by mouth Daily       Associated Diagnoses: --    VITAMIN D3/VITAMIN K2, MK4, (K2 PLUS D3 ORAL)    Take by mouth       Associated Diagnoses: --   Modified Medications    No medications on file   Discontinued Medications    No medications on file        ALLERGIES:  Allergies[1]    REVIEW OF SYSTEMS:  A 12 point review of systems was conducted and was negative except as noted per HPI.    EXAM:  There were no vitals filed for this visit.      HZW:tzoo developed, no distress, sitting comfortably in chair  HEENT: normocephalic, anicteric sclera, no temporal wasting  CV: regular rate and rhythm, no murmur  PULM: clear to auscultation bilaterally, unlabored respirations  ABD: soft, nondistended, mild tenderness in RUQ, no organomegaly  EXT: no edema  DERM: no jaundice, no rash, no spider angioma, + tattoos  NEURO: A&OX3    LABS:  Labs from today were reviewed and pertinent for:  WBC 8.1, hemoglobin 16.2, platelets 317, creatinine 0.73, sodium 144, AST 20, ALT 25, bilirubin 0.3, alkaline phosphatase 93, GGT 25, albumin 4.1, lipase 138, ferritin 73, % saturation 21, ceruloplasmin 30, alpha-1 antitrypsin 131, hepatitis a IgG reactive, hepatitis-B naive, hepatitis-C negative    IMAGING:  US  liver 09/16/23:  Ultrasound of the liver is performed for follow steatosis demonstrating mildly increased echogenicity of the liver compatible with steatosis. There is no evidence of cirrhosis or visible ascites. There is no biliary ductal dilatation. An echogenic subcapsular focus in the right hepatic lobe measuring 4.9 x 4.4 x 4.6 cm compatible with a cavernous hemangioma as confirmed on previous contrast-enhanced MRI from 01/16/2022. The liver is normal in size measuring 15.8 cm in length. The portal vein exhibits appropriate directional flow and is normal in caliber measuring 0.9 cm in diameter.     MRI liver 01/16/22:  FINDINGS: Liver: Normal size and morphology. Diffuse hepatic steatosis. 6.6 cm segment 7/6 liver lesion with lobulated hyperintense signal intensity on T2-weighted sequences discontinuous nodular enhancement with gradual centripetal filling consistent with classic hemangioma. 1.3 cm segment 7 and 1.0 cm segment 8 lesions also with lobulated hyperintense signal  intensity on T2-weighted sequences discontinuous nodular enhancement with gradual centripetal filling consistent with classic hemangiomas. No suspicious liver lesion. Portal and hepatic veins patent. GALLBLADDER AND BILE DUCTS: No cholelithiasis or bile duct dilation. SPLEEN: Normal size PANCREAS: Mild fatty atrophy. 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 KIDNEYS AND URETERS: No hydronephrosis GASTROINTESTINAL TRACT: Included bowel normal caliber. PERITONEUM AND RETROPERITONEUM: No ascites LYMPH NODES: No lymphadenopathy. VASCULATURE: Patent. SOFT TISSUES: Normal BONES: Multilevel degenerative change of spine. Probable intraosseous hemangioma at L3-L4 vertebral bodies. OTHER: Partially imaged bilateral adnexal cysts. Partially imaged uterine and endometrial masses.     PROCEDURES:  Colonoscopy earlier this year summarized per HPI    IMPRESSION:  Deasha Debar Plate is a 48 y.o. female who I am seeing for further evaluation of hepatic hemangiomas and steatosis.    Regarding hemangiomas, these are benign in nature.  While there had been some enlargement since 2015, they have been stable over the last 2 years.  No further evaluation is required at this time but given  the size of her largest hemangioma, she may benefit from reimaging in the next couple of years or she develops worsening abdominal pain.    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  Fib 4 is low but given her history of diabetes, I would like to check a ultrasound elastography for further noninvasive fibrosis testing.  We reviewed the pathophysiology, natural history, and potential complications of MASH.  We discussed management strategies such as lifestyle modification, pharmacotherapy, and bariatric procedures.  Further recommendations will be based on fibrosis staging.    Regarding abdominal pain, it is not entirely clear what is causing it at this time.  No  significant gallbladder pathology is noted on imaging.  I encouraged the patient to monitor for provoking factors.  She also reports dyspeptic symptoms.  She has mild lipase elevation of unclear significance.  We will monitor her ability to tolerate ongoing GLP 1 therapy.    The patient notes a history of IBS but seems to be under reasonable control.  Recent colonoscopy did not show any significant abnormality aside from a small polyp.      RECOMMENDATIONS:  - CBC, CMP, serologies for chronic liver disease ordered and noted as above  - Await celiac panel, autoimmune serologies as part of chronic liver disease evaluation  - Ultrasound with elastography ordered  - Discussed lifestyle modifications for management of MASH  - Additional discussions regarding mash directed pharmacotherapy versus bariatric procedures based on noninvasive fibrosis testing and response to lifestyle modifications  - Continue aggressive control of metabolic comorbidities with PCP  - Recommend hepatitis-B vaccination    RETURN TO CLINIC:  Three months    On the day of the encounter, a total of 62 minutes was spent on this patient encounter including review of historical information, examination, documentation and post-visit activities. The time documented excludes procedural time.      Marolyn Cantor, MD         [1]   Allergies  Allergen Reactions    Adhesive Rash     OK with paper and silk tape    Metformin Diarrhea    Bactrim [Sulfamethoxazole-Trimethoprim]     Duloxetine  Other Adverse Reaction (Add comment)    Trulicity [Dulaglutide]     Empagliflozin  Other Adverse Reaction (Add comment)    Iodinated Contrast Media Itching

## 2023-11-17 LAB — HEP-2 SUBSTRATE ANTINUCLEAR ANTIBODIES (ANA), SERUM: ANA INTERPRETATION: NEGATIVE

## 2023-11-18 LAB — CELIAC SCREENING PROFILE, IGA WITH REFLEX TO IGG, SERUM
GLIADIN (DEAMIDATED) ANTIBODY IGA QUALITATIVE: POSITIVE — AB
GLIADIN (DEAMIDATED) ANTIBODY IGA QUANTITATIVE: 23.7 U/mL — ABNORMAL HIGH (ref <15.0)
TISSUE TRANSGLUTAMINASE ANTIBODIES IGA QUALITATIVE: NEGATIVE
TISSUE TRANSGLUTAMINASE ANTIBODIES IGA QUANTITATIVE: <0.5 U/mL (ref <15.0)

## 2023-11-21 ENCOUNTER — Other Ambulatory Visit: Payer: Self-pay

## 2023-11-21 ENCOUNTER — Ambulatory Visit
Admission: RE | Admit: 2023-11-21 | Discharge: 2023-11-21 | Disposition: A | Discharge status: Home / Self Care | Payer: PRIVATE HEALTH INSURANCE | Source: Ambulatory Visit

## 2023-11-21 ENCOUNTER — Ambulatory Visit (HOSPITAL_COMMUNITY): Payer: Self-pay

## 2023-11-21 DIAGNOSIS — D1803 Hemangioma of intra-abdominal structures: Secondary | ICD-10-CM

## 2023-11-21 DIAGNOSIS — K760 Fatty (change of) liver, not elsewhere classified: Secondary | ICD-10-CM | POA: Insufficient documentation

## 2023-11-21 DIAGNOSIS — K581 Irritable bowel syndrome with constipation: Secondary | ICD-10-CM | POA: Insufficient documentation

## 2023-11-21 DIAGNOSIS — K828 Other specified diseases of gallbladder: Secondary | ICD-10-CM

## 2023-11-22 LAB — SMOOTH MUSCLE ANTIBODIES, SERUM: SMOOTH MUSCLE AB SCREEN, S: NEGATIVE

## 2023-12-16 ENCOUNTER — Encounter (INDEPENDENT_AMBULATORY_CARE_PROVIDER_SITE_OTHER): Payer: Self-pay

## 2023-12-22 ENCOUNTER — Encounter (INDEPENDENT_AMBULATORY_CARE_PROVIDER_SITE_OTHER): Payer: Self-pay

## 2023-12-30 IMAGING — CT CT ABD-PELV W/ CM
2 of 5 series · 15 of 46 positions shown, 17 images · IV contrast (APPLIED)
Comparison: 01/02/2014

CLINICAL DATA: Abdominal pain, nausea and vomiting

EXAM:
CT ABDOMEN AND PELVIS WITH CONTRAST
TECHNIQUE: Multidetector CT imaging of the abdomen and pelvis was performed
using the standard protocol following bolus administration of
intravenous contrast.

[Series 3: abdomen 5.0 · axial · 0.98mm/px · z∈[+1053,+1463]mm · 12 of 98 slices shown, 14 images]
[im 8/98  soft-tissue]
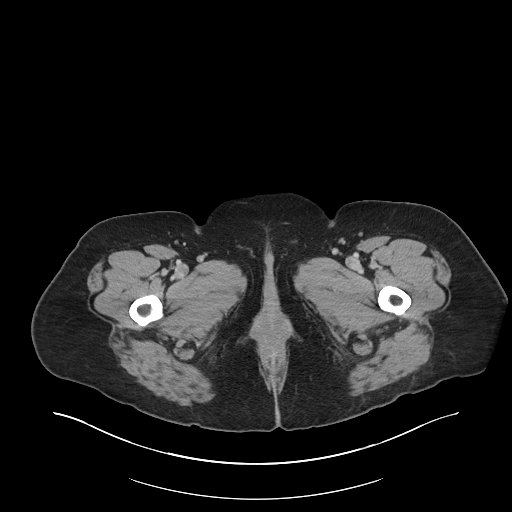
[im 8/98  bone]
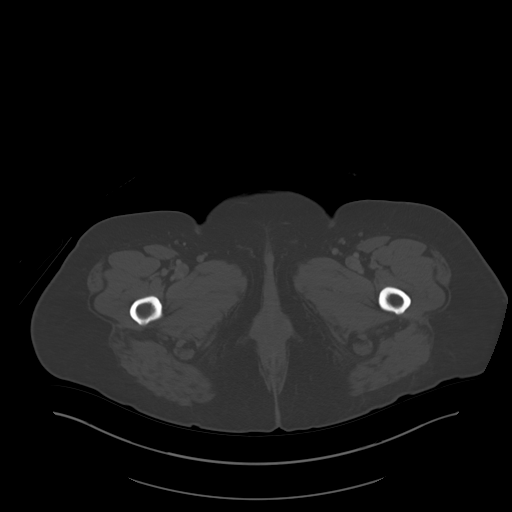
[im 15/98  soft-tissue]
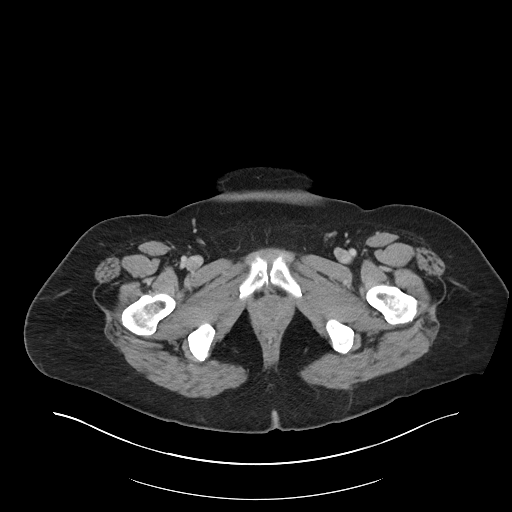
[im 23/98  soft-tissue]
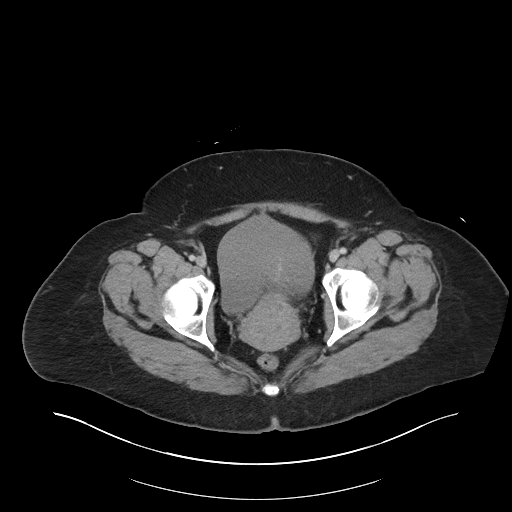
[im 30/98  soft-tissue]
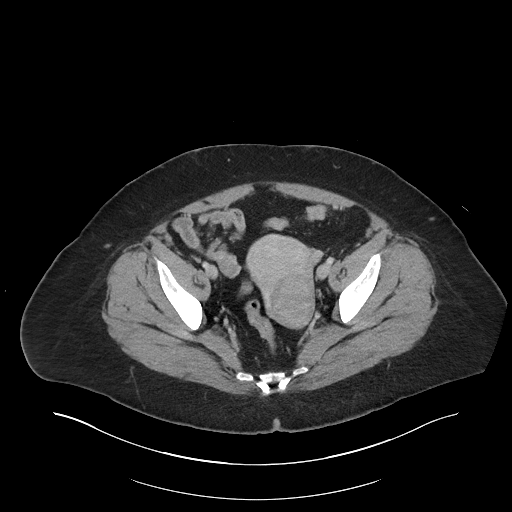
[im 38/98  soft-tissue]
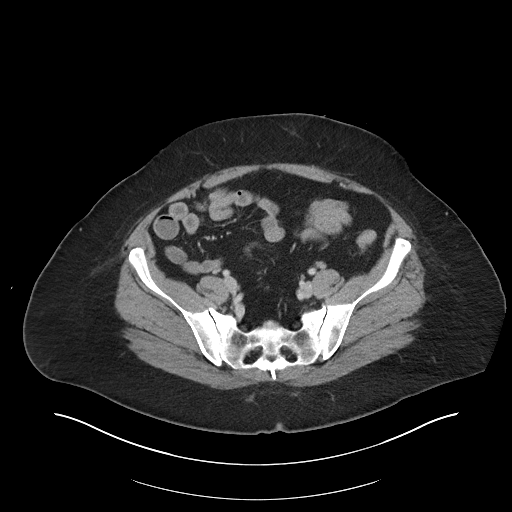
[im 45/98  soft-tissue]
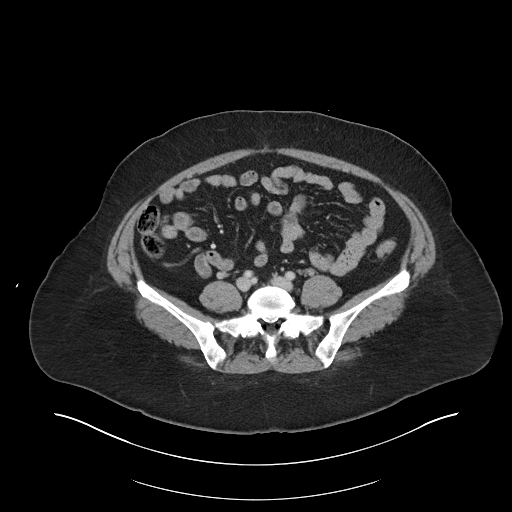
[im 53/98  soft-tissue]
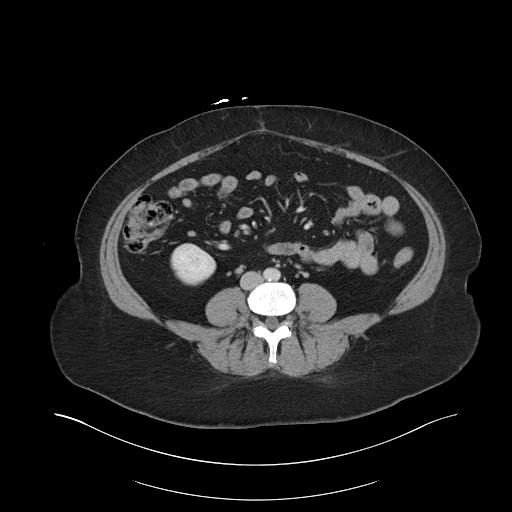
[im 60/98  soft-tissue]
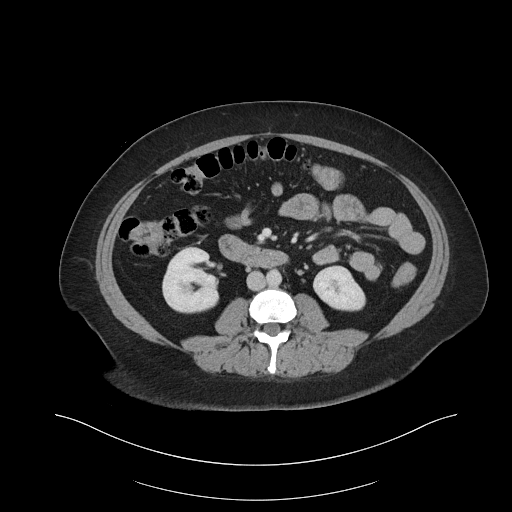
[im 68/98  soft-tissue]
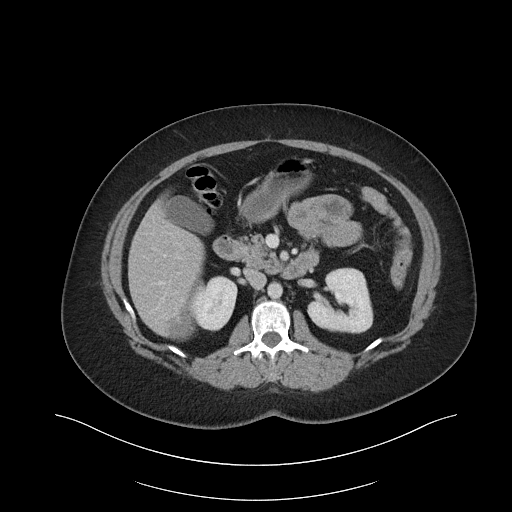
[im 68/98  bone]
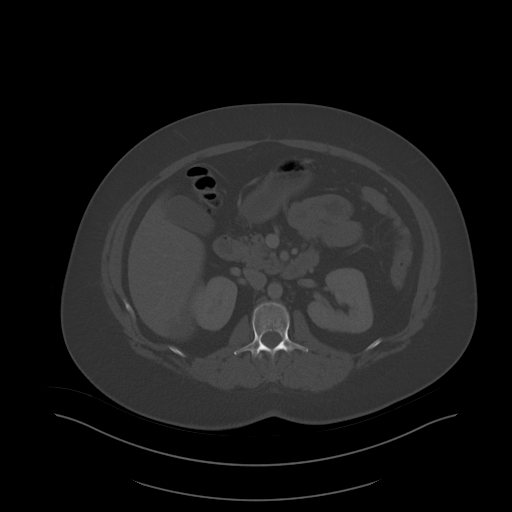
[im 75/98  soft-tissue]
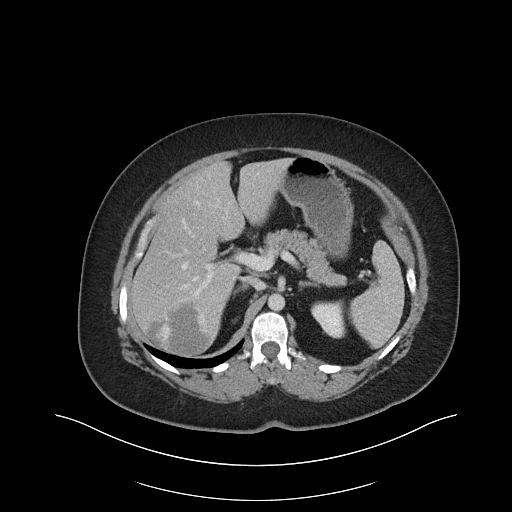
[im 83/98  soft-tissue]
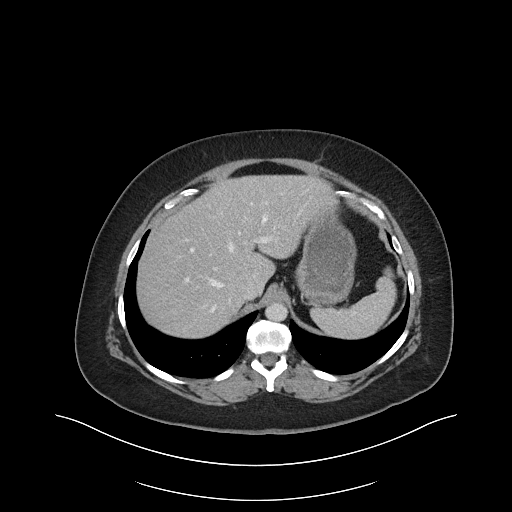
[im 90/98  soft-tissue]
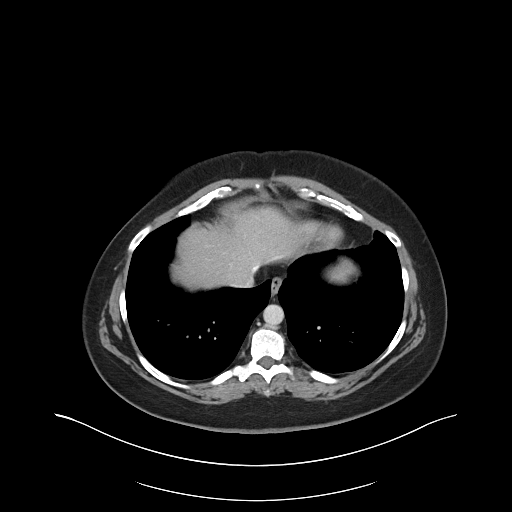

[Series 6: abdomen 3.0 mpr cor · coronal · 0.95mm/px · 3 of 120 slices shown]
[im 40/120  soft-tissue]
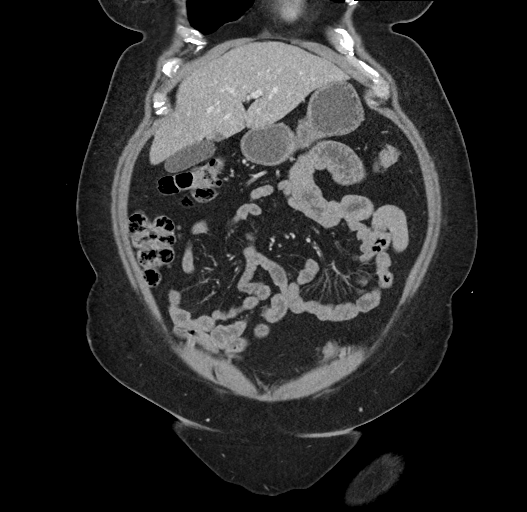
[im 53/120  soft-tissue]
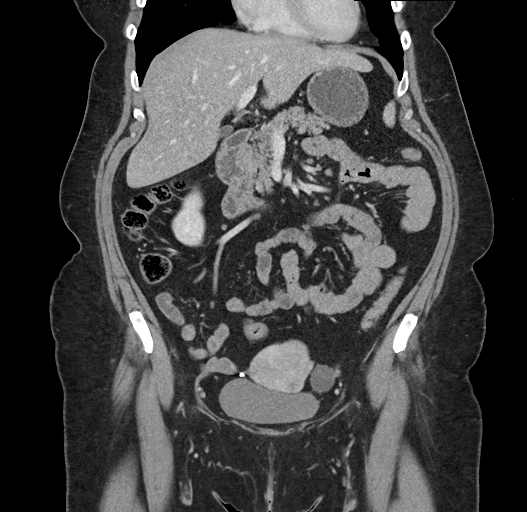
[im 67/120  soft-tissue]
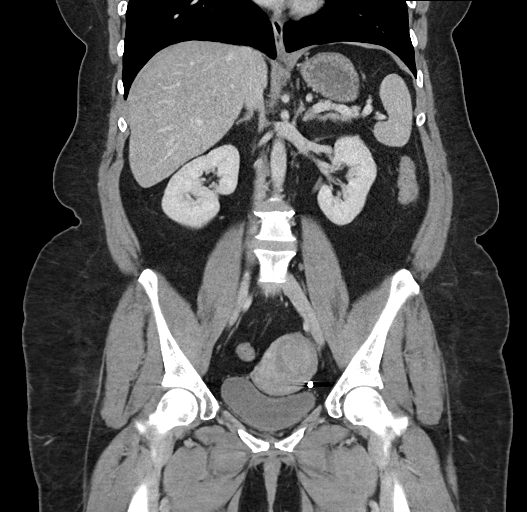

[15 of 46 positions shown; findings below may reference images not displayed]

RADIATION DOSE REDUCTION: This exam was performed according to the
departmental dose-optimization program which includes automated
exposure control, adjustment of the mA and/or kV according to
patient size and/or use of iterative reconstruction technique.

CONTRAST:  80mL OMNIPAQUE IOHEXOL 350 MG/ML SOLN
FINDINGS: Lower chest: No acute abnormality.

Hepatobiliary: Posterior right liver lobulated hypodense mass with
peripheral nodular enhancement. With delayed imaging there is
further nodular enhancement. Lesion now measures 6.4 x 5.4 by
cm, previously 3.6 x 3.2 x 4.6 cm. Lesion is compatible with a
hemangioma but has enlarged compared to 1714. No CT evidence of
acute hemorrhage. No Perihepatic free fluid or hematoma.

No other significant hepatic abnormality. No biliary dilatation.
Hepatic and portal veins are patent. Gallbladder unremarkable.
Common bile duct nondilated.

Pancreas: Unremarkable. No pancreatic ductal dilatation or
surrounding inflammatory changes.

Spleen: Normal in size without focal abnormality.

Adrenals/Urinary Tract: Adrenal glands are unremarkable. Kidneys are
normal, without renal calculi, focal lesion, or hydronephrosis.
Bladder is unremarkable.

Stomach/Bowel: Stomach is within normal limits. Appendix appears
normal. No evidence of bowel wall thickening, distention, or
inflammatory changes.

Vascular/Lymphatic: Minimal distal aortic bifurcation
atherosclerosis. Negative for aneurysm. No occlusive process or
acute finding. Mesenteric and renal vasculature all appear patent.
No Bristow process. Negative for adenopathy.

Reproductive: Left lower uterine segment rounded enhancing uterine
mass measures 5.2 cm compatible with a lower uterine segment
fibroid. Tubal ligation clips noted. Ovaries are normal in size.
Left adnexal stable 2.3 cm cyst again noted, query broad ligament
cyst. No other pelvic abnormality. No free fluid.

Other: No abdominal wall hernia or abnormality. No abdominopelvic
ascites.

Musculoskeletal: No acute or significant osseous findings.
IMPRESSION: No acute intra-abdominal or pelvic finding by CT.

Enlarging posterior right liver subcapsular hemangioma compared to
1714.

5.2 cm left lower uterine segment fibroid

Stable benign 2.3 cm left adnexal probable broad ligament cyst.

## 2024-02-07 ENCOUNTER — Encounter (INDEPENDENT_AMBULATORY_CARE_PROVIDER_SITE_OTHER): Payer: Self-pay

## 2024-02-23 ENCOUNTER — Ambulatory Visit (INDEPENDENT_AMBULATORY_CARE_PROVIDER_SITE_OTHER): Payer: Self-pay

## 2024-03-13 ENCOUNTER — Ambulatory Visit: Admit: 2024-03-13 | Payer: PRIVATE HEALTH INSURANCE

## 2024-03-13 ENCOUNTER — Encounter (HOSPITAL_COMMUNITY): Payer: Self-pay

## 2024-03-13 SURGERY — GASTROSCOPY WITH BIOPSY
Anesthesia: Monitor Anesthesia Care | Site: Mouth
